# Patient Record
Sex: Male | Born: 1955 | Race: White | Hispanic: No | Smoking: Never smoker
Health system: Southern US, Community
[De-identification: ages and names within clinical notes are randomized; demographics above are authoritative.]

## PROBLEM LIST (undated history)

## (undated) DIAGNOSIS — R569 Unspecified convulsions: Secondary | ICD-10-CM

## (undated) DIAGNOSIS — M5020 Other cervical disc displacement, unspecified cervical region: Secondary | ICD-10-CM

## (undated) DIAGNOSIS — T4145XA Adverse effect of unspecified anesthetic, initial encounter: Secondary | ICD-10-CM

## (undated) DIAGNOSIS — R52 Pain, unspecified: Secondary | ICD-10-CM

## (undated) DIAGNOSIS — T8859XA Other complications of anesthesia, initial encounter: Secondary | ICD-10-CM

## (undated) DIAGNOSIS — S86019A Strain of unspecified Achilles tendon, initial encounter: Secondary | ICD-10-CM

## (undated) DIAGNOSIS — Z87442 Personal history of urinary calculi: Secondary | ICD-10-CM

---

## 1988-12-09 HISTORY — PX: BACK SURGERY: SHX140

## 2002-03-13 ENCOUNTER — Encounter: Admission: RE | Admit: 2002-03-13 | Discharge: 2002-03-13 | Payer: Self-pay | Admitting: Internal Medicine

## 2002-03-13 ENCOUNTER — Encounter: Payer: Self-pay | Admitting: Internal Medicine

## 2008-12-17 ENCOUNTER — Ambulatory Visit: Payer: Self-pay | Admitting: Gastroenterology

## 2009-06-02 ENCOUNTER — Emergency Department (HOSPITAL_BASED_OUTPATIENT_CLINIC_OR_DEPARTMENT_OTHER): Admission: EM | Admit: 2009-06-02 | Discharge: 2009-06-02 | Payer: Self-pay | Admitting: Emergency Medicine

## 2010-06-23 ENCOUNTER — Encounter (INDEPENDENT_AMBULATORY_CARE_PROVIDER_SITE_OTHER): Payer: Self-pay | Admitting: *Deleted

## 2010-07-13 NOTE — Letter (Signed)
Summary: Pre Visit Letter Revised  [facility]  [address] [place], [place] [address]   Phone: [phone]  Fax: [phone]        06/23/2010 MRN: [id number] Jose Rhodes [address] [place], [place]  [address]             Procedure Date:  08/03/2010 @ 11:00   Direct colon-Dr. Arlyce Dice   Welcome to the Gastroenterology Division at [facility].    You are scheduled to see a nurse for your pre-procedure visit on 07/20/2010 at 3:30 on the 3rd floor at [facility], [address]  We ask that you try to arrive at our office 15 minutes prior to your appointment time to allow for check-in.  Please take a minute to review the attached form.  If you answer "Yes" to one or more of the questions on the first page, we ask that you call the person listed at your earliest opportunity.  If you answer "No" to all of the questions, please complete the rest of the form and bring it to your appointment.    Your nurse visit will consist of discussing your medical and surgical history, your immediate family medical history, and your medications.   If you are unable to list all of your medications on the form, please bring the medication bottles to your appointment and we will list them.  We will need to be aware of both prescribed and over the counter drugs.  We will need to know exact dosage information as well.    Please be prepared to read and sign documents such as consent forms, a financial agreement, and acknowledgement forms.  If necessary, and with your consent, a friend or relative is welcome to sit-in on the nurse visit with you.  Please bring your insurance card so that we may make a copy of it.  If your insurance requires a referral to see a specialist, please bring your referral form from your primary care physician.  No co-pay is required for this nurse visit.     If you cannot keep your appointment, please call [phone] to cancel or reschedule prior to your  appointment date.  This allows [place] the opportunity to schedule an appointment for another patient in need of care.    Thank you for choosing [facility] for your medical needs.  We appreciate the opportunity to care for you.  Please visit [place] at our website  to learn more about our practice.  Sincerely, The Gastroenterology Division

## 2010-07-19 ENCOUNTER — Encounter (INDEPENDENT_AMBULATORY_CARE_PROVIDER_SITE_OTHER): Payer: Self-pay | Admitting: *Deleted

## 2010-07-20 ENCOUNTER — Encounter: Payer: Self-pay | Admitting: Gastroenterology

## 2010-07-27 NOTE — Miscellaneous (Signed)
Summary: [facility]  Clinical Lists Changes  Medications: Added new medication of MOVIPREP 100 GM  SOLR (PEG-KCL-NACL-NASULF-NA ASC-C) As per prep instructions. - Signed Rx of MOVIPREP 100 GM  SOLR (PEG-KCL-NACL-NASULF-NA ASC-C) As per prep instructions.;  #1 x 0;  Signed;  Entered by: Ezra Sites RN;  Authorized by: Louis Meckel MD;  Method used: [facility]  [phone]*, [address] [place], [place]  [address], Ph: [phone] or [phone], Fax: [phone] Observations: Added new observation of NKA: T (07/20/2010 15:15)    Prescriptions: MOVIPREP 100 GM  SOLR (PEG-KCL-NACL-NASULF-NA ASC-C) As per prep instructions.  #1 x 0   Entered by:   Ezra Sites RN   Authorized by:   Louis Meckel MD   Signed by:   Ezra Sites RN on 07/20/2010   Method used:   [facility]ically to        [facility]  [phone]* (retail)       [address] [place], [place]  [address]       Ph: [phone] or [phone]       Fax: [phone]   RxID:   [id number]

## 2010-07-27 NOTE — Letter (Signed)
Summary: Moviprep Instructions  [facility]  [address]   [place], [place] [address]   Phone: [phone]  Fax: [phone]       Jose Rhodes    11/22/1955    MRN: [id number]        Procedure Day Dorna Bloom: Thursday, 08-03-10     Arrival Time: 10:00 a.m.     Procedure Time: 11:00 a.m.     Location of Procedure:                    x  [facility] (4th Floor)                        PREPARATION FOR COLONOSCOPY WITH MOVIPREP   Starting 5 days prior to your procedure 07-29-10 do not eat nuts, seeds, popcorn, corn, beans, peas,  salads, or any raw vegetables.  Do not take any fiber supplements (e.g. Metamucil, Citrucel, and Benefiber).  THE DAY BEFORE YOUR PROCEDURE         DATE: 08-02-10  DAY: Wednesday  1.  Drink clear liquids the entire day-NO SOLID FOOD  2.  Do not drink anything colored red or purple.  Avoid juices with pulp.  No orange juice.  3.  Drink at least 64 oz. (8 glasses) of fluid/clear liquids during the day to prevent dehydration and help the prep work efficiently.  CLEAR LIQUIDS INCLUDE: Water Jello Ice Popsicles Tea (sugar ok, no milk/cream) Powdered fruit flavored drinks Coffee (sugar ok, no milk/cream) Gatorade Juice: apple, white grape, white cranberry  Lemonade Clear bullion, consomm, broth Carbonated beverages (any kind) Strained chicken noodle soup Hard Candy                             4.  In the morning, mix first dose of MoviPrep solution:    Empty 1 Pouch A and 1 Pouch B into the disposable container    Add lukewarm drinking water to the top line of the container. Mix to dissolve    Refrigerate (mixed solution should be used within 24 hrs)  5.  Begin drinking the prep at 5:00 p.m. The MoviPrep container is divided by 4 marks.   Every 15 minutes drink the solution down to the next mark (approximately 8 oz) until the full liter is complete.   6.  Follow completed prep with 16 oz of clear liquid of your choice  (Nothing red or purple).  Continue to drink clear liquids until bedtime.  7.  Before going to bed, mix second dose of MoviPrep solution:    Empty 1 Pouch A and 1 Pouch B into the disposable container    Add lukewarm drinking water to the top line of the container. Mix to dissolve    Refrigerate  THE DAY OF YOUR PROCEDURE      DATE: 08-03-10  DAY: Thursday  Beginning at 6:00 a.m. (5 hours before procedure):         1. Every 15 minutes, drink the solution down to the next mark (approx 8 oz) until the full liter is complete.  2. Follow completed prep with 16 oz. of clear liquid of your choice.    3. You may drink clear liquids until 9:00 a.m.  (2 HOURS BEFORE PROCEDURE).   MEDICATION INSTRUCTIONS  Unless otherwise instructed, you should take regular prescription medications with a small sip of water   as early as possible the morning  of your procedure.         OTHER INSTRUCTIONS  You will need a responsible adult at least 54 years of age to accompany you and drive you home.   This person must remain in the waiting room during your procedure.  Wear loose fitting clothing that is easily removed.  Leave jewelry and other valuables at home.  However, you may wish to bring a book to read or  an iPod/MP3 player to listen to music as you wait for your procedure to start.  Remove all body piercing jewelry and leave at home.  Total time from sign-in until discharge is approximately 2-3 hours.  You should go home directly after your procedure and rest.  You can resume normal activities the  day after your procedure.  The day of your procedure you should not:   Drive   Make legal decisions   Operate machinery   Drink alcohol   Return to work  You will receive specific instructions about eating, activities and medications before you leave.    The above instructions have been reviewed and explained to me by   Ezra Sites RN  July 20, 2010 3:46 PM    I fully  understand and can verbalize these instructions _____________________________ Date _________

## 2010-08-03 ENCOUNTER — Encounter: Payer: Self-pay | Admitting: Gastroenterology

## 2010-08-03 ENCOUNTER — Other Ambulatory Visit (AMBULATORY_SURGERY_CENTER): Payer: BC Managed Care – PPO | Admitting: Gastroenterology

## 2010-08-03 DIAGNOSIS — Z1211 Encounter for screening for malignant neoplasm of colon: Secondary | ICD-10-CM

## 2010-08-03 DIAGNOSIS — Z800 Family history of malignant neoplasm of digestive organs: Secondary | ICD-10-CM

## 2010-08-08 NOTE — Procedures (Signed)
Summary: Colonoscopy  Patient: Jose Rhodes Note: All result statuses are Final unless otherwise noted.  Tests: (1) Colonoscopy (COL)   COL Colonoscopy           DONE     [facility]     [address]     [place], [place]  [address]           COLONOSCOPY PROCEDURE REPORT           PATIENT:  Jose, Rhodes  MR#:  [id number]     BIRTHDATE:  09-03-1955, 54 yrs. old  GENDER:  male           ENDOSCOPIST:  Barbette Hair. Arlyce Dice, MD     Referred by:  Nila Nephew, M.D.           PROCEDURE DATE:  08/03/2010     PROCEDURE:  Diagnostic Colonoscopy     ASA CLASS:  Class I     INDICATIONS:  1) Elevated Risk Screening  2) family history of     colon cancer Mother in 53's           MEDICATIONS:   Fentanyl 75 mcg IV, Versed 8 mg IV           DESCRIPTION OF PROCEDURE:   After the risks benefits and     alternatives of the procedure were thoroughly explained, informed     consent was obtained.  Digital rectal exam was performed and     revealed large external hemorrhoids.   The LB CF-H180AL [id number]     endoscope was introduced through the anus and advanced to the     cecum, which was identified by both the appendix and ileocecal     valve, without limitations.  The quality of the prep was     excellent, using MoviPrep.  The instrument was then slowly     withdrawn as the colon was fully examined.     <<PROCEDUREIMAGES>>           FINDINGS:  A normal appearing cecum, ileocecal valve, and     appendiceal orifice were identified. The ascending, hepatic     flexure, transverse, splenic flexure, descending, sigmoid colon,     and rectum appeared unremarkable (see image3, image5, image6,     image8, image12, image14, and image15).   Retroflexed views in the     rectum revealed no abnormalities.    The time to cecum =  2.50     minutes. The scope was then withdrawn (time =  6.50  min) from the     patient and the procedure completed.           COMPLICATIONS:  None           ENDOSCOPIC  IMPRESSION:     1) Normal colon     RECOMMENDATIONS:     1) Given your significant family history of colon cancer, you     should have a repeat colonoscopy in 5 years           REPEAT EXAM:   5 year(s) Colonoscopy           ______________________________     Barbette Hair. Arlyce Dice, MD           CC:           n.     eSIGNED:   Barbette Hair. Kaplan at 08/03/2010 11:35 AM           Clinton Gallant, [id number]  Note:  An exclamation mark (!) indicates a result that was not dispersed into the flowsheet. Document Creation Date: 08/03/2010 11:35 AM _______________________________________________________________________  (1) Order result status: Final Collection or observation date-time: 08/03/2010 11:31 Requested date-time:  Receipt date-time:  Reported date-time:  Referring Physician:   Ordering Physician: Melvia Heaps [phone]) Specimen Source:  Source: Launa Grill Order Number: [id number] Lab site:   Appended Document: Colonoscopy    Clinical Lists Changes  Observations: Added new observation of COLONNXTDUE: 07/2015 (08/03/2010 16:32)

## 2013-10-25 ENCOUNTER — Emergency Department (HOSPITAL_COMMUNITY): Payer: 59

## 2013-10-25 ENCOUNTER — Encounter (HOSPITAL_COMMUNITY): Payer: Self-pay | Admitting: Emergency Medicine

## 2013-10-25 ENCOUNTER — Emergency Department (HOSPITAL_COMMUNITY)
Admission: EM | Admit: 2013-10-25 | Discharge: 2013-10-25 | Disposition: A | Discharge status: Home / Self Care | Payer: 59 | Attending: Emergency Medicine | Admitting: Emergency Medicine

## 2013-10-25 DIAGNOSIS — S96819A Strain of other specified muscles and tendons at ankle and foot level, unspecified foot, initial encounter: Principal | ICD-10-CM

## 2013-10-25 DIAGNOSIS — M66369 Spontaneous rupture of flexor tendons, unspecified lower leg: Secondary | ICD-10-CM

## 2013-10-25 DIAGNOSIS — X500XXA Overexertion from strenuous movement or load, initial encounter: Secondary | ICD-10-CM | POA: Insufficient documentation

## 2013-10-25 DIAGNOSIS — Z87442 Personal history of urinary calculi: Secondary | ICD-10-CM | POA: Insufficient documentation

## 2013-10-25 DIAGNOSIS — S93499A Sprain of other ligament of unspecified ankle, initial encounter: Secondary | ICD-10-CM | POA: Insufficient documentation

## 2013-10-25 DIAGNOSIS — Y9364 Activity, baseball: Secondary | ICD-10-CM | POA: Insufficient documentation

## 2013-10-25 DIAGNOSIS — Y92838 Other recreation area as the place of occurrence of the external cause: Secondary | ICD-10-CM

## 2013-10-25 DIAGNOSIS — Y9239 Other specified sports and athletic area as the place of occurrence of the external cause: Secondary | ICD-10-CM | POA: Insufficient documentation

## 2013-10-25 MED ORDER — HYDROCODONE-ACETAMINOPHEN 5-325 MG PO TABS: 1.0000 | ORAL_TABLET | Freq: Four times a day (QID) | ORAL | Status: DC | PRN

## 2013-10-25 MED ORDER — NAPROXEN 500 MG PO TABS: 500.0000 mg | ORAL_TABLET | Freq: Two times a day (BID) | ORAL | Status: DC

## 2013-10-25 NOTE — Progress Notes (Signed)
Orthopedic Tech Progress Note Patient Details:  Jose Rhodes 10/08/1955 [id number]  Ortho Devices Type of Ortho Device: Ace wrap;Crutches;Post (short leg) splint Ortho Device/Splint Location: RLE Ortho Device/Splint Interventions: Ordered;Application   Jennye MoccasinAnthony Craig Damaria Vachon 10/25/2013, 7:26 PM

## 2013-10-25 NOTE — ED Notes (Signed)
Ortho at BS

## 2013-10-25 NOTE — ED Notes (Signed)
Pt reports playing baseball today and felt a pop in the back of his right ankle. Pt able to move affected foot.

## 2013-10-25 NOTE — Discharge Instructions (Signed)
Complete Achilles Tendon Rupture  An Achilles tendon rupture is an injury in which the tough, cord-like band that attaches the lower muscles of your leg to your heel (Achilles tendon) tears (ruptures). In a complete Achilles tendon rupture, you are not able to stand up on the toes of the injured leg.  CAUSES  A tendon may rupture if it is weakened or weakening (degenerative) and a sudden stress is applied to it. Weakening or degeneration of a tendon may be caused by:  · Recurrent injuries, such as those causing Achilles tendinitis.  · Damaged tendons.  · Aging.  · Vascular disease of the tendon.  SIGNS AND SYMPTOMS  · Feeling as if you were struck violently in the back of the ankle.  · Hearing a "pop" and experiencing severe, sudden (acute) pain (however, the absence of pain does not mean there was not a rupture).  DIAGNOSIS  A physical exam is usually all that is needed to diagnose an Achilles tendon rupture. During the exam, your health care provider will touch the tendon and the structures around it. You may be asked to lie on your stomach or kneel on a chair while your health care provider squeezes your calf muscle. You most likely have a ruptured tendon if your foot does not flex.   Sometimes tests are performed. These may include:  · An ultrasound. This allows quick confirmation of the diagnosis.  · An X-ray.  · An MRI.  TREATMENT   A complete Achilles tendon rupture is treated with surgery. You will have a cast while the injury heals (this usually takes 6 10 weeks). Before your surgery, treatment may consist of:  · Ice applied to the area.  · Pain-relieving medicines.  · Rest.  · Crutches.  · Keeping the ankle from moving (immobilization ), usually with a splint.  HOME CARE INSTRUCTIONS   · Apply ice to the injured area:    · Put ice in a plastic bag.    · Place a towel between your skin and the bag.    · Leave the ice on for 20 minutes, 2 3 times a day.    · Use crutches and move about only as instructed.     · Keep the leg elevated above the level of the heart (the center of the chest) at all times when not using the bathroom. Do not dangle the leg over a chair, couch, or bed. When lying down, elevate your leg on a few pillows. Elevation prevents swelling and reduces pain.    · Avoid use of the injured area other than gentle range of motion of the toes.    · Do not drive a car until your health care provider specifically tells you it is safe to do so.    · Only take over-the-counter or prescription medicines for pain, discomfort, or fever as directed by your health care provider.  · Keep all follow-up appointments with your health care provider.  SEEK MEDICAL CARE IF:   · Your pain and swelling increase, or your pain is not controlled by medicines.    · You have new, unexplained symptoms, or your symptoms get worse.    · You cannot move your toes or foot.  · You develop warmth and swelling in your foot.  · You have an unexplained fever.  MAKE SURE YOU:   · Understand these instructions.  · Will watch your condition.  · Will get help right away if you are not doing well or get worse.  Document Released: 03/07/2005 Document Revised:   03/18/2013 Document Reviewed: 01/16/2013  Exit[employer]® Patient Information ©2014 [employer].

## 2013-10-25 NOTE — ED Provider Notes (Signed)
CSN: [id number]     Arrival date & time 10/25/13  1712 History  This chart was scribed for non-physician practitioner, Arthor CaptainAbigail Aminata Buffalo, PA-C working with Glynn OctaveStephen Rancour, MD by Greggory StallionKayla Andersen, ED scribe. This patient was seen in room TR08C/TR08C and the patient's care was started at 6:55 PM.   Chief Complaint  Patient presents with  . Ankle Pain   The history is provided by the patient. No language interpreter was used.   HPI Comments: Dub Mikesddie F Balderston is a 58 y.o. male who presents to the Emergency Department complaining of right ankle injury that occurred earlier today around 4:15 PM. Pt was playing baseball and felt a pop in the back of his ankle. He has sudden onset right ankle pain with associated swelling. Bearing weight worsens the pain.   Past Medical History  Diagnosis Date  . Kidney stones    Past Surgical History  Procedure Laterality Date  . Back surgery     No family history on file. History  Substance Use Topics  . Smoking status: Never Smoker   . Smokeless tobacco: Not on file  . Alcohol Use: No    Review of Systems  Constitutional: Negative for fever.  HENT: Negative for congestion.   Eyes: Negative for redness.  Respiratory: Negative for shortness of breath.   Cardiovascular: Negative for chest pain.  Gastrointestinal: Negative for abdominal distention.  Musculoskeletal: Positive for arthralgias and joint swelling.  Skin: Negative for rash.  Neurological: Negative for speech difficulty.  Psychiatric/Behavioral: Negative for confusion.   Allergies  Review of patient's allergies indicates no known allergies.  Home Medications   Prior to Admission medications   Not on File   BP 109/63  Pulse 82  Temp(Src) 98.2 F (36.8 C) (Oral)  Resp 16  Ht 5\' 11"  (1.803 m)  Wt 180 lb (81.647 kg)  BMI 25.12 kg/m2  SpO2 97%  Physical Exam  Nursing note and vitals reviewed. Constitutional: He is oriented to person, place, and time. He appears well-developed and  well-nourished. No distress.  HENT:  Head: Normocephalic and atraumatic.  Eyes: EOM are normal.  Neck: Neck supple. No tracheal deviation present.  Cardiovascular: Normal rate.   Pulmonary/Chest: Effort normal. No respiratory distress.  Musculoskeletal: Normal range of motion.  Positive Thompson test on right side. Right achilles tendon very soft with swelling superiorly.   Neurological: He is alert and oriented to person, place, and time.  Skin: Skin is warm and dry.  Psychiatric: He has a normal mood and affect. His behavior is normal.    ED Course  Procedures (including critical care time)  DIAGNOSTIC STUDIES: Oxygen Saturation is 97% on RA, normal by my interpretation.    COORDINATION OF CARE: 6:59 PM-Discussed treatment plan which includes posterior splint and orthopedic follow up with pt at bedside and pt agreed to plan.   Labs Review Labs Reviewed - No data to display  Imaging Review Dg Ankle Complete Right  10/25/2013   CLINICAL DATA:  Excruciating right ankle pain  EXAM: RIGHT ANKLE - COMPLETE 3+ VIEW  COMPARISON:  None.  FINDINGS: There is no evidence of fracture, dislocation. There is no evidence of arthropathy or other focal bone abnormality. There is swelling posterior soft tissues of the ankle.  IMPRESSION: No acute bony injury. If Achilles tendon tear is of clinical concern, MRI would be helpful.   Electronically Signed   By: Sherian ReinWei-Chen  Lin M.D.   On: 10/25/2013 18:17     EKG Interpretation None  MDM   Final diagnoses:  Tendon rupture, nontraumatic, Achilles    Patient with apparent L complete achilles tendon rupture.  Unable to ambulate. I have placed the patient in Posterior leg splint and given crutches. He is to remain NWB. Pain medications given. F/u with ortho ASAP.  I personally performed the services described in this documentation, which was scribed in my presence. The recorded information has been reviewed and is accurate.  Arthor CaptainAbigail Michille Mcelrath,  PA-C 10/27/13 [phone]

## 2013-10-27 NOTE — ED Provider Notes (Signed)
Medical screening examination/treatment/procedure(s) were performed by non-physician practitioner and as supervising physician I was immediately available for consultation/collaboration.   EKG Interpretation None        Jacquita Mulhearn, MD 10/27/13 1453 

## 2013-10-28 ENCOUNTER — Other Ambulatory Visit: Payer: Self-pay | Admitting: Surgical

## 2013-10-28 NOTE — H&P (Signed)
Jose Rhodes is an 58 y.o. male.   Chief Complaint: right ankle pain HPI: The patient is a 58 year old male who presented pain which began 3 days ago following a specific injury. The injury occurred while the patient was playing sports. He was playing in a baseball game and felt a pop in his Achilles when he stepped on third base. Symptoms are reported to be located in the right ankle and include swelling, decreased range of motion, difficulty bearing weight and difficulty ambulating. The patient does describe their pain as sharp and aching. The patient describes their symptoms as moderate in severity. Symptoms are exacerbated by weight bearing and walking. Symptoms are relieved by immobilization and non-opioid analgesics.   Past Medical History  Diagnosis Date  . Kidney stones     Past Surgical History  Procedure Laterality Date  . Back surgery      Social History:  reports that he has never smoked. He does not have any smokeless tobacco history on file. He reports that he does not drink alcohol or use illicit drugs.  Allergies: No Known Allergies  Medication History Naproxen Sodium ER (500MG  Tablet ER 24HR, Oral) Active.  Review of Systems  Constitutional: Negative.   HENT: Negative.   Eyes: Negative.   Respiratory: Negative.   Cardiovascular: Negative.   Gastrointestinal: Negative.   Genitourinary: Negative.   Musculoskeletal: Positive for falls and joint pain. Negative for back pain, myalgias and neck pain.       Right ankle pain  Skin: Negative.   Neurological: Negative.   Endo/Heme/Allergies: Negative.   Psychiatric/Behavioral: Negative.    Vitals Weight: 180 lb Height: 71 in Body Surface Area: 2.02 m Body Mass Index: 25.1 kg/m BP: 107/74 (Sitting, Left Arm, Standard)  HR: 64 bpm  Physical Exam  Constitutional: He is oriented to person, place, and time. He appears well-developed and well-nourished. No distress.  HENT:  Head: Normocephalic and  atraumatic.  Right Ear: External ear normal.  Left Ear: External ear normal.  Nose: Nose normal.  Mouth/Throat: Oropharynx is clear and moist.  Eyes: Conjunctivae and EOM are normal.  Neck: Normal range of motion. Neck supple.  Cardiovascular: Normal rate, regular rhythm, normal heart sounds and intact distal pulses.   No murmur heard. Respiratory: Effort normal and breath sounds normal. No respiratory distress. He has no wheezes.  GI: Bowel sounds are normal. He exhibits no distension. There is no tenderness.  Musculoskeletal:       Right knee: Normal.       Left knee: Normal.       Right ankle: He exhibits decreased range of motion, swelling and ecchymosis. He exhibits normal pulse. No tenderness. No lateral malleolus, no medial malleolus, no AITFL and no head of 5th metatarsal tenderness found. Achilles tendon exhibits pain, defect and abnormal Thompson's test results.       Left ankle: Normal.  Neurological: He is alert and oriented to person, place, and time. He has normal strength and normal reflexes. No sensory deficit.  Skin: No rash noted. He is not diaphoretic. No erythema.  Psychiatric: He has a normal mood and affect. His behavior is normal.     Assessment/Plan Right Achilles tendon rupture He needs a right Achilles tendon repair with graft. Risks and benefits of the surgery discussed with the patient by Dr. Darrelyn HillockGioffre.   Jimya Ciani Tamala SerLauren Neosha Switalski 10/28/2013, 3:58 PM

## 2013-10-29 ENCOUNTER — Encounter (HOSPITAL_COMMUNITY): Payer: Self-pay | Admitting: *Deleted

## 2013-10-29 NOTE — Progress Notes (Signed)
PT IS ADD ON FOR SURGERY TOMORROW - PREOP INSTRUCTIONS AND CHLORHEXIDINE INSTRUCTIONS/ PRECAUTIONS REVIEWED WITH PT BY PHONE.

## 2013-10-30 ENCOUNTER — Encounter (HOSPITAL_COMMUNITY): Payer: Self-pay

## 2013-10-30 ENCOUNTER — Encounter (HOSPITAL_COMMUNITY): Payer: 59 | Admitting: Anesthesiology

## 2013-10-30 ENCOUNTER — Ambulatory Visit (HOSPITAL_COMMUNITY): Payer: 59

## 2013-10-30 ENCOUNTER — Encounter (HOSPITAL_COMMUNITY)
Admission: RE | Disposition: A | Discharge status: Home / Self Care | Payer: Self-pay | Source: Ambulatory Visit | Attending: Orthopedic Surgery

## 2013-10-30 ENCOUNTER — Observation Stay (HOSPITAL_COMMUNITY)
Admission: RE | Admit: 2013-10-30 | Discharge: 2013-10-31 | Disposition: A | Discharge status: Home / Self Care | Payer: 59 | Source: Ambulatory Visit | Attending: Orthopedic Surgery | Admitting: Orthopedic Surgery

## 2013-10-30 ENCOUNTER — Ambulatory Visit (HOSPITAL_COMMUNITY): Payer: 59 | Admitting: Anesthesiology

## 2013-10-30 DIAGNOSIS — X58XXXA Exposure to other specified factors, initial encounter: Secondary | ICD-10-CM | POA: Insufficient documentation

## 2013-10-30 DIAGNOSIS — Y9364 Activity, baseball: Secondary | ICD-10-CM | POA: Insufficient documentation

## 2013-10-30 DIAGNOSIS — S96819A Strain of other specified muscles and tendons at ankle and foot level, unspecified foot, initial encounter: Principal | ICD-10-CM

## 2013-10-30 DIAGNOSIS — S93499A Sprain of other ligament of unspecified ankle, initial encounter: Principal | ICD-10-CM | POA: Insufficient documentation

## 2013-10-30 DIAGNOSIS — S86019A Strain of unspecified Achilles tendon, initial encounter: Secondary | ICD-10-CM | POA: Diagnosis present

## 2013-10-30 HISTORY — DX: Personal history of urinary calculi: Z87.442

## 2013-10-30 HISTORY — DX: Adverse effect of unspecified anesthetic, initial encounter: T41.45XA

## 2013-10-30 HISTORY — DX: Other cervical disc displacement, unspecified cervical region: M50.20

## 2013-10-30 HISTORY — PX: ACHILLES TENDON SURGERY: SHX542

## 2013-10-30 HISTORY — DX: Strain of unspecified achilles tendon, initial encounter: S86.019A

## 2013-10-30 HISTORY — DX: Pain, unspecified: R52

## 2013-10-30 HISTORY — DX: Other complications of anesthesia, initial encounter: T88.59XA

## 2013-10-30 HISTORY — DX: Unspecified convulsions: R56.9

## 2013-10-30 LAB — PROTIME-INR
INR: 1.04 (ref 0.00–1.49)
Prothrombin Time: 13.4 seconds (ref 11.6–15.2)

## 2013-10-30 LAB — COMPREHENSIVE METABOLIC PANEL
ALT: 18 U/L (ref 0–53)
AST: 19 U/L (ref 0–37)
Albumin: 3.7 g/dL (ref 3.5–5.2)
Alkaline Phosphatase: 73 U/L (ref 39–117)
BUN: 17 mg/dL (ref 6–23)
CO2: 28 mEq/L (ref 19–32)
Calcium: 9 mg/dL (ref 8.4–10.5)
Chloride: 105 mEq/L (ref 96–112)
Creatinine, Ser: 0.99 mg/dL (ref 0.50–1.35)
GFR calc Af Amer: >90 mL/min (ref >90)
GFR calc non Af Amer: 90 mL/min — ABNORMAL LOW (ref >90)
Glucose, Bld: 93 mg/dL (ref 70–99)
Potassium: 4.1 mEq/L (ref 3.7–5.3)
Sodium: 143 mEq/L (ref 137–147)
Total Bilirubin: 0.8 mg/dL (ref 0.3–1.2)
Total Protein: 6.6 g/dL (ref 6.0–8.3)

## 2013-10-30 LAB — CBC
HCT: 43.9 % (ref 39.0–52.0)
HEMOGLOBIN: 15.2 g/dL (ref 13.0–17.0)
MCH: 30.1 pg (ref 26.0–34.0)
MCHC: 34.6 g/dL (ref 30.0–36.0)
MCV: 86.9 fL (ref 78.0–100.0)
Platelets: 169 10*3/uL (ref 150–400)
RBC: 5.05 MIL/uL (ref 4.22–5.81)
RDW: 12.7 % (ref 11.5–15.5)
WBC: 4.7 10*3/uL (ref 4.0–10.5)

## 2013-10-30 LAB — URINALYSIS, ROUTINE W REFLEX MICROSCOPIC
Bilirubin Urine: NEGATIVE
Glucose, UA: NEGATIVE mg/dL
Hgb urine dipstick: NEGATIVE
Ketones, ur: NEGATIVE mg/dL
Leukocytes, UA: NEGATIVE
Nitrite: NEGATIVE
Protein, ur: NEGATIVE mg/dL
Specific Gravity, Urine: 1.023 (ref 1.005–1.030)
Urobilinogen, UA: 0.2 mg/dL (ref 0.0–1.0)
pH: 5.5 (ref 5.0–8.0)

## 2013-10-30 LAB — APTT: aPTT: 26 seconds (ref 24–37)

## 2013-10-30 SURGERY — REPAIR, TENDON, ACHILLES
Anesthesia: General | Site: Ankle | Laterality: Right

## 2013-10-30 MED ORDER — ALPRAZOLAM 1 MG PO TABS
1.0000 mg | ORAL_TABLET | Freq: Every day | ORAL | Status: DC
  Administered 2013-10-30: 1 mg via ORAL
  Filled 2013-10-30: qty 1

## 2013-10-30 MED ORDER — FENTANYL CITRATE 0.05 MG/ML IJ SOLN
INTRAMUSCULAR | Status: AC
  Filled 2013-10-30: qty 2

## 2013-10-30 MED ORDER — HYDROCODONE-ACETAMINOPHEN 5-325 MG PO TABS
1.0000 | ORAL_TABLET | ORAL | Status: DC | PRN
  Administered 2013-10-30 – 2013-10-31 (×6): 2 via ORAL
  Filled 2013-10-30 (×6): qty 2

## 2013-10-30 MED ORDER — LACTATED RINGERS IV SOLN
INTRAVENOUS | Status: DC
  Administered 2013-10-30: 22:00:00 via INTRAVENOUS

## 2013-10-30 MED ORDER — CEFAZOLIN SODIUM 1-5 GM-% IV SOLN
1.0000 g | Freq: Four times a day (QID) | INTRAVENOUS | Status: AC
  Administered 2013-10-30 – 2013-10-31 (×3): 1 g via INTRAVENOUS
  Filled 2013-10-30 (×3): qty 50

## 2013-10-30 MED ORDER — ONDANSETRON HCL 4 MG PO TABS: 4.0000 mg | ORAL_TABLET | Freq: Four times a day (QID) | ORAL | Status: DC | PRN

## 2013-10-30 MED ORDER — POLYETHYLENE GLYCOL 3350 17 G PO PACK: 17.0000 g | PACK | Freq: Every day | ORAL | Status: DC | PRN

## 2013-10-30 MED ORDER — ONDANSETRON HCL 4 MG/2ML IJ SOLN
INTRAMUSCULAR | Status: AC
  Filled 2013-10-30: qty 2

## 2013-10-30 MED ORDER — PROPOFOL 10 MG/ML IV BOLUS
INTRAVENOUS | Status: AC
  Filled 2013-10-30: qty 20

## 2013-10-30 MED ORDER — FLEET ENEMA 7-19 GM/118ML RE ENEM: 1.0000 | ENEMA | Freq: Once | RECTAL | Status: AC | PRN

## 2013-10-30 MED ORDER — FENTANYL CITRATE 0.05 MG/ML IJ SOLN
INTRAMUSCULAR | Status: AC
  Filled 2013-10-30: qty 5

## 2013-10-30 MED ORDER — ONDANSETRON HCL 4 MG/2ML IJ SOLN: 4.0000 mg | Freq: Four times a day (QID) | INTRAMUSCULAR | Status: DC | PRN

## 2013-10-30 MED ORDER — LACTATED RINGERS IV SOLN: INTRAVENOUS | Status: DC

## 2013-10-30 MED ORDER — BISACODYL 10 MG RE SUPP: 10.0000 mg | Freq: Every day | RECTAL | Status: DC | PRN

## 2013-10-30 MED ORDER — FENTANYL CITRATE 0.05 MG/ML IJ SOLN
INTRAMUSCULAR | Status: DC | PRN
  Administered 2013-10-30: 100 ug via INTRAVENOUS

## 2013-10-30 MED ORDER — MIDAZOLAM HCL 2 MG/2ML IJ SOLN
INTRAMUSCULAR | Status: AC
  Filled 2013-10-30: qty 2

## 2013-10-30 MED ORDER — HYDROMORPHONE HCL PF 1 MG/ML IJ SOLN
1.0000 mg | INTRAMUSCULAR | Status: DC | PRN
Start: 2013-10-30 — End: 2013-10-31

## 2013-10-30 MED ORDER — ENOXAPARIN SODIUM 40 MG/0.4ML ~~LOC~~ SOLN
40.0000 mg | SUBCUTANEOUS | Status: DC
  Administered 2013-10-31: 40 mg via SUBCUTANEOUS
  Filled 2013-10-30 (×3): qty 0.4

## 2013-10-30 MED ORDER — METHOCARBAMOL 1000 MG/10ML IJ SOLN
500.0000 mg | Freq: Four times a day (QID) | INTRAVENOUS | Status: DC | PRN
  Administered 2013-10-30: 500 mg via INTRAVENOUS
  Filled 2013-10-30: qty 5

## 2013-10-30 MED ORDER — POLYMYXIN B SULFATE 500000 UNITS IJ SOLR
INTRAMUSCULAR | Status: DC | PRN
  Administered 2013-10-30: 10:00:00

## 2013-10-30 MED ORDER — BACITRACIN-NEOMYCIN-POLYMYXIN 400-5-5000 EX OINT
TOPICAL_OINTMENT | CUTANEOUS | Status: AC
  Filled 2013-10-30: qty 1

## 2013-10-30 MED ORDER — SUCCINYLCHOLINE CHLORIDE 20 MG/ML IJ SOLN
INTRAMUSCULAR | Status: DC | PRN
  Administered 2013-10-30: 100 mg via INTRAVENOUS

## 2013-10-30 MED ORDER — OXYCODONE-ACETAMINOPHEN 5-325 MG PO TABS
1.0000 | ORAL_TABLET | ORAL | Status: DC | PRN
  Filled 2013-10-30: qty 2

## 2013-10-30 MED ORDER — MIDAZOLAM HCL 5 MG/5ML IJ SOLN
INTRAMUSCULAR | Status: DC | PRN
  Administered 2013-10-30: 2 mg via INTRAVENOUS

## 2013-10-30 MED ORDER — EPHEDRINE SULFATE 50 MG/ML IJ SOLN
INTRAMUSCULAR | Status: DC | PRN
  Administered 2013-10-30: 7.5 mg via INTRAVENOUS

## 2013-10-30 MED ORDER — BACITRACIN-NEOMYCIN-POLYMYXIN 400-5-5000 EX OINT
TOPICAL_OINTMENT | CUTANEOUS | Status: DC | PRN
  Administered 2013-10-30: 1 via TOPICAL

## 2013-10-30 MED ORDER — CEFAZOLIN SODIUM-DEXTROSE 2-3 GM-% IV SOLR
2.0000 g | INTRAVENOUS | Status: AC
  Administered 2013-10-30: 2 g via INTRAVENOUS

## 2013-10-30 MED ORDER — LACTATED RINGERS IV SOLN
INTRAVENOUS | Status: DC
  Administered 2013-10-30 (×3): via INTRAVENOUS

## 2013-10-30 MED ORDER — PROPOFOL 10 MG/ML IV BOLUS
INTRAVENOUS | Status: DC | PRN
  Administered 2013-10-30: 150 mg via INTRAVENOUS

## 2013-10-30 MED ORDER — CEFAZOLIN SODIUM-DEXTROSE 2-3 GM-% IV SOLR
INTRAVENOUS | Status: AC
  Filled 2013-10-30: qty 50

## 2013-10-30 MED ORDER — ONDANSETRON HCL 4 MG/2ML IJ SOLN
INTRAMUSCULAR | Status: DC | PRN
  Administered 2013-10-30: 4 mg via INTRAVENOUS

## 2013-10-30 MED ORDER — METHOCARBAMOL 500 MG PO TABS
500.0000 mg | ORAL_TABLET | Freq: Four times a day (QID) | ORAL | Status: DC | PRN
  Administered 2013-10-30 – 2013-10-31 (×2): 500 mg via ORAL
  Filled 2013-10-30 (×2): qty 1

## 2013-10-30 MED ORDER — BUPIVACAINE LIPOSOME 1.3 % IJ SUSP
20.0000 mL | Freq: Once | INTRAMUSCULAR | Status: AC
  Administered 2013-10-30: 10 mL
  Filled 2013-10-30: qty 20

## 2013-10-30 MED ORDER — FENTANYL CITRATE 0.05 MG/ML IJ SOLN
25.0000 ug | INTRAMUSCULAR | Status: DC | PRN
  Administered 2013-10-30: 50 ug via INTRAVENOUS

## 2013-10-30 SURGICAL SUPPLY — 38 items
BAG SPEC THK2 15X12 ZIP CLS (MISCELLANEOUS) ×1
BAG ZIPLOCK 12X15 (MISCELLANEOUS) ×3 IMPLANT
BIT DRILL 2.8X128 (BIT) ×1 IMPLANT
BIT DRILL 2.8X128MM (BIT)
BLADE SURG SZ10 CARB STEEL (BLADE) ×3 IMPLANT
BNDG GAUZE ELAST 4 BULKY (GAUZE/BANDAGES/DRESSINGS) ×2 IMPLANT
DERMASPAN .5-.9MM 4X4CM SHOU (Miscellaneous) ×2 IMPLANT
DRSG PAD ABDOMINAL 8X10 ST (GAUZE/BANDAGES/DRESSINGS) ×3 IMPLANT
DURAPREP 26ML APPLICATOR (WOUND CARE) ×3 IMPLANT
ELECT REM PT RETURN 9FT ADLT (ELECTROSURGICAL) ×3
ELECTRODE REM PT RTRN 9FT ADLT (ELECTROSURGICAL) ×1 IMPLANT
GAUZE SPONGE 4X4 16PLY XRAY LF (GAUZE/BANDAGES/DRESSINGS) ×2 IMPLANT
GLOVE BIOGEL PI IND STRL 8 (GLOVE) ×1 IMPLANT
GLOVE BIOGEL PI INDICATOR 8 (GLOVE) ×2
GLOVE ECLIPSE 8.0 STRL XLNG CF (GLOVE) ×6 IMPLANT
GOWN STRL REUS W/TWL LRG LVL3 (GOWN DISPOSABLE) ×9 IMPLANT
GOWN STRL REUS W/TWL XL LVL3 (GOWN DISPOSABLE) ×6 IMPLANT
MANIFOLD NEPTUNE II (INSTRUMENTS) ×3 IMPLANT
NS IRRIG 1000ML POUR BTL (IV SOLUTION) ×3 IMPLANT
PACK LOWER EXTREMITY WL (CUSTOM PROCEDURE TRAY) ×3 IMPLANT
PAD ABD 8X10 STRL (GAUZE/BANDAGES/DRESSINGS) ×2 IMPLANT
PAD CAST 4YDX4 CTTN HI CHSV (CAST SUPPLIES) ×1 IMPLANT
PADDING CAST COTTON 4X4 STRL (CAST SUPPLIES) ×9
PADDING CAST COTTON 6X4 STRL (CAST SUPPLIES) ×3 IMPLANT
PASSER SUT SWANSON 36MM LOOP (INSTRUMENTS) ×3 IMPLANT
POSITIONER SURGICAL ARM (MISCELLANEOUS) ×1 IMPLANT
SPLINT FIBERGLASS 4X30 (CAST SUPPLIES) ×2 IMPLANT
SPONGE GAUZE 4X4 12PLY (GAUZE/BANDAGES/DRESSINGS) ×3 IMPLANT
STAPLER VISISTAT (STAPLE) ×3 IMPLANT
SUT ETHIBOND #5 BRAIDED 30INL (SUTURE) ×2 IMPLANT
SUT ETHILON 2 0 PS N (SUTURE) ×2 IMPLANT
SUT ETHILON 3 0 PS 1 (SUTURE) ×6 IMPLANT
SUT VIC AB 1 CT1 27 (SUTURE) ×9
SUT VIC AB 1 CT1 27XBRD ANTBC (SUTURE) ×1 IMPLANT
SUT VIC AB 2-0 CT1 27 (SUTURE) ×3
SUT VIC AB 2-0 CT1 TAPERPNT 27 (SUTURE) ×1 IMPLANT
TOWEL OR 17X26 10 PK STRL BLUE (TOWEL DISPOSABLE) ×6 IMPLANT
WATER STERILE IRR 1500ML POUR (IV SOLUTION) ×3 IMPLANT

## 2013-10-30 NOTE — Interval H&P Note (Signed)
History and Physical Interval Note:  10/30/2013 8:38 AM  Jose Rhodes  has presented today for surgery, with the diagnosis of RIGHT ACHILLES TENDON REPUTRE  The various methods of treatment have been discussed with the patient and family. After consideration of risks, benefits and other options for treatment, the patient has consented to  Procedure(s): RIGHT ACHILLES TENDON REPAIR WITH GRAFT  (Right) as a surgical intervention .  The patient's history has been reviewed, patient examined, no change in status, stable for surgery.  I have reviewed the patient's chart and labs.  Questions were answered to the patient's satisfaction.     Jacki Cones

## 2013-10-30 NOTE — Anesthesia Postprocedure Evaluation (Signed)
  Anesthesia Post-op Note  Patient: Jose Rhodes  Procedure(s) Performed: Procedure(s) (LRB): RIGHT ACHILLES TENDON REPAIR WITH GRAFT  (Right)  Patient Location: PACU  Anesthesia Type: General  Level of Consciousness: awake and alert   Airway and Oxygen Therapy: Patient Spontanous Breathing  Post-op Pain: mild  Post-op Assessment: Post-op Vital signs reviewed, Patient's Cardiovascular Status Stable, Respiratory Function Stable, Patent Airway and No signs of Nausea or vomiting  Last Vitals:  Filed Vitals:   10/30/13 1200  BP: 115/69  Pulse: 58  Temp: 36.3 C  Resp: 15    Post-op Vital Signs: stable   Complications: No apparent anesthesia complications

## 2013-10-30 NOTE — Anesthesia Preprocedure Evaluation (Signed)
Anesthesia Evaluation  Patient identified by MRN, date of birth, ID band Patient awake    Reviewed: Allergy & Precautions, H&P , NPO status , Patient's Chart, lab work & pertinent test results  Airway Mallampati: II  TM Distance: >3 FB Neck ROM: full    Dental no notable dental hx. (+) Teeth Intact, Dental Advisory Given   Pulmonary neg pulmonary ROS,  breath sounds clear to auscultation  Pulmonary exam normal       Cardiovascular Exercise Tolerance: Good negative cardio ROS  Rhythm:regular Rate:Normal     Neuro/Psych negative neurological ROS  negative psych ROS   GI/Hepatic negative GI ROS, Neg liver ROS,   Endo/Other  negative endocrine ROS  Renal/GU negative Renal ROS  negative genitourinary   Musculoskeletal   Abdominal   Peds  Hematology negative hematology ROS (+)   Anesthesia Other Findings   Reproductive/Obstetrics negative OB ROS                             Anesthesia Physical Anesthesia Plan  ASA: I  Anesthesia Plan: General   Post-op Pain Management:    Induction: Intravenous  Airway Management Planned: LMA  Additional Equipment:   Intra-op Plan:   Post-operative Plan:   Informed Consent: I have reviewed the patients History and Physical, chart, labs and discussed the procedure including the risks, benefits and alternatives for the proposed anesthesia with the patient or authorized representative who has indicated his/her understanding and acceptance.   Dental Advisory Given  Plan Discussed with: CRNA and Surgeon  Anesthesia Plan Comments:         Anesthesia Quick Evaluation  

## 2013-10-30 NOTE — Transfer of Care (Signed)
Immediate Anesthesia Transfer of Care Note  Patient: Jose Rhodes  Procedure(s) Performed: Procedure(s): RIGHT ACHILLES TENDON REPAIR WITH GRAFT  (Right)  Patient Location: PACU  Anesthesia Type:General  Level of Consciousness: awake, sedated and patient cooperative  Airway & Oxygen Therapy: Patient Spontanous Breathing and Patient connected to face mask oxygen  Post-op Assessment: Report given to PACU RN and Post -op Vital signs reviewed and stable  Post vital signs: Reviewed and stable  Complications: No apparent anesthesia complications

## 2013-10-30 NOTE — Op Note (Signed)
NAMETAKUMA, CANNING NO.:  [id number]  MEDICAL RECORD NO.:  [id number]  LOCATION:  1616                         FACILITY:  [facility]  PHYSICIAN:  Georges Lynch. Josilyn Shippee, M.D.DATE OF BIRTH:  June 01, 1956  DATE OF PROCEDURE:  10/30/2013 DATE OF DISCHARGE:                              OPERATIVE REPORT   SURGEON:  Georges Lynch. Darrelyn Hillock, M.D.  ASSISTANT:  Marlowe Kays, M.D.  PREOPERATIVE DIAGNOSIS:  Complete complex ruptured Achilles tendon on the right secondary to trauma.  POSTOPERATIVE DIAGNOSIS:  Complete complex ruptured Achilles tendon on the right secondary to trauma.  OPERATIONS: 1. Repair of a complete complex tear of the rotator cuff tendon on the     right. 2. DermaSpan graft for reinforcement of the repair. 3. Application of a short-leg cast with his foot in plantar flexion.  DESCRIPTION OF PROCEDURE:  Under general anesthesia, routine orthopedic prep and draping of the right lower extremity was carried out.  He had 2 g of IV Ancef.  The appropriate time-out was carried out prior to surgery.  I also marked the appropriate right leg in the holding area. The leg was exsanguinated with Esmarch.  Tourniquet was elevated at 325 mmHg.  At this time, a curvilinear incision was made over the medial aspect of the Achilles tendon.  Bleeders were identified and cauterized. I then identified both ends of the tendon.  Note, the tendon had a mop handle-type appearance.  It was severely torn.  What I did at this particular point, I brought both ends together with the foot acutely plantar flexed and then sutured it with #5-0 Ethibond suture and I reinforced it with some circumferential Vicryl sutures.  I then utilized a circumferential DermaSpan graft to reinforce the repair.  We had a nice repair.  At this time, we irrigated out the wound and closed the wound with 3-0 nylon suture.  Sterile Neosporin dressings were applied. All large bundle dressing was applied with the  patient's foot in plantar flexion.  At the end of the procedure, I did inject 10 mL of Exparel into the wound site.          ______________________________ Georges Lynch. Darrelyn Hillock, M.D.     RAG/MEDQ  D:  10/30/2013  T:  10/30/2013  Job:  [id number]

## 2013-10-30 NOTE — Brief Op Note (Signed)
10/30/2013  10:35 AM  PATIENT:  Dub Mikes  58 y.o. male  PRE-OPERATIVE DIAGNOSIS:  RIGHT ACHILLES TENDON REPUTRE  POST-OPERATIVE DIAGNOSIS:  RIGHT ACHILLES TENDON REPUTRE,Complete,Complex.  PROCEDURE:  Procedure(s): RIGHT ACHILLES TENDON REPAIR WITH GRAFT  (Right)  SURGEON:  Surgeon(s) and Role:    * Jacki Cones, MD - Primary    * Drucilla Schmidt, MD - Assisting  :   ASSISTANTS:James Aplington MD  ANESTHESIA:   general  EBL:  Total I/O In: 1000 [I.V.:1000] Out: -   BLOOD ADMINISTERED:none  DRAINS: none   LOCAL MEDICATIONS USED:  BUPIVICAINE 10cc  SPECIMEN:  No Specimen  DISPOSITION OF SPECIMEN:  N/A  COUNTS:  YES  TOURNIQUET:   Total Tourniquet Time Documented: Thigh (Right) - 32 minutes Total: Thigh (Right) - 32 minutes   DICTATION: .Other Dictation: Dictation Number [id number]  PLAN OF CARE: Admit for overnight observation  PATIENT DISPOSITION:  PACU - hemodynamically stable.   Delay start of Pharmacological VTE agent (>24hrs) due to surgical blood loss or risk of bleeding: yes

## 2013-10-31 LAB — BASIC METABOLIC PANEL
BUN: 10 mg/dL (ref 6–23)
CALCIUM: 8.5 mg/dL (ref 8.4–10.5)
CO2: 30 mEq/L (ref 19–32)
Chloride: 106 mEq/L (ref 96–112)
Creatinine, Ser: 1.01 mg/dL (ref 0.50–1.35)
GFR calc non Af Amer: 81 mL/min — ABNORMAL LOW (ref >90)
GLUCOSE: 95 mg/dL (ref 70–99)
Potassium: 4.5 mEq/L (ref 3.7–5.3)
SODIUM: 144 meq/L (ref 137–147)

## 2013-10-31 MED ORDER — ENOXAPARIN (LOVENOX) PATIENT EDUCATION KIT
PACK | Freq: Once | Status: AC
  Administered 2013-10-31: 09:00:00
  Filled 2013-10-31: qty 1

## 2013-10-31 MED ORDER — ENOXAPARIN SODIUM 40 MG/0.4ML ~~LOC~~ SOLN
40.0000 mg | SUBCUTANEOUS | Status: DC
Start: 2013-10-31 — End: 2016-06-05

## 2013-10-31 MED ORDER — OXYCODONE-ACETAMINOPHEN 5-325 MG PO TABS: 1.0000 | ORAL_TABLET | ORAL | Status: DC | PRN

## 2013-10-31 NOTE — Evaluation (Signed)
Physical Therapy Evaluation Patient Details Name: Jose Rhodes MRN: [id number] DOB: 06/21/1955 Today's Date: 10/31/2013   History of Present Illness  R achillies repair  Clinical Impression  Pt s/p R achilles repair presents with functional mobility limitations 2* NWB status and post op pain with activity.  Pt demonstrates ability to mobilize safely utilizing crutches or knee scooter and states he has assist as needed at home.  Pt ready for d/c this date from PT perspective.    Follow Up Recommendations No PT follow up    Equipment Recommendations  Other (comment) (Pt is very interested in a knee scooter)    Recommendations for Other Services       Precautions / Restrictions Precautions Precautions: Fall Restrictions Weight Bearing Restrictions: Yes RLE Weight Bearing: Non weight bearing      Mobility  Bed Mobility Overal bed mobility: Modified Independent             General bed mobility comments: no difficulties  Transfers Overall transfer level: Needs assistance Equipment used: Crutches Transfers: Sit to/from Stand Sit to Stand: Min guard;Supervision;Modified independent (Device/Increase time)         General transfer comment: cues for management of crutches, saftey in transition position and use of UEs  Ambulation/Gait Ambulation/Gait assistance: Supervision;Modified independent (Device/Increase time) Ambulation Distance (Feet): 200 Feet Assistive device: Crutches Gait Pattern/deviations: Step-through pattern     General Gait Details: pt managing crutches with no difficulty.  Crutches readjusted to fit better.  Pt also mobilized 400' with knee scooter with min cues  Stairs Stairs: Yes Stairs assistance: Min guard Stair Management: One rail Right;Step to pattern;Forwards;With crutches Number of Stairs: 4 General stair comments: cues for crutch placement and increased reliance on UEs vs LEs and hopping  Wheelchair Mobility    Modified Rankin  (Stroke Patients Only)       Balance                                             Pertinent Vitals/Pain 5/10 at rest; 8/10 with dependency of R LE during ambulation    Home Living Family/patient expects to be discharged to:: Private residence Living Arrangements: Children Available Help at Discharge: Family Type of Home: House Home Access: Stairs to enter Entrance Stairs-Rails: Right Entrance Stairs-Number of Steps: 2 Home Layout: One level Home Equipment: Crutches      Prior Function Level of Independence: Independent with assistive device(s)         Comments: pt on crutches x 1 week prior to surgery     Hand Dominance        Extremity/Trunk Assessment   Upper Extremity Assessment: Overall WFL for tasks assessed           Lower Extremity Assessment: RLE deficits/detail RLE Deficits / Details: ankle/foot immobilized    Cervical / Trunk Assessment: Normal  Communication   Communication: No difficulties  Cognition Arousal/Alertness: Awake/alert Behavior During Therapy: WFL for tasks assessed/performed Overall Cognitive Status: Within Functional Limits for tasks assessed                      General Comments      Exercises        Assessment/Plan    PT Assessment Patent does not need any further PT services  PT Diagnosis     PT Problem List    PT Treatment Interventions  PT Goals (Current goals can be found in the Care Plan section) Acute Rehab PT Goals Patient Stated Goal: Resume previous lifestyle asap PT Goal Formulation: No goals set, d/c therapy    Frequency     Barriers to discharge        Co-evaluation               End of Session   Activity Tolerance: Patient tolerated treatment well Patient left: in bed;with call bell/phone within reach Nurse Communication: Mobility status    Functional Assessment Tool Used: clinical judgement Functional Limitation: Mobility: Walking and moving  around Mobility: Walking and Moving Around Current Status (Q3335): At least 1 percent but less than 20 percent impaired, limited or restricted Mobility: Walking and Moving Around Goal Status [phone]): At least 1 percent but less than 20 percent impaired, limited or restricted Mobility: Walking and Moving Around Discharge Status [phone]): At least 1 percent but less than 20 percent impaired, limited or restricted    Time: 0806-0845 PT Time Calculation (min): 39 min   Charges:   PT Evaluation $Initial PT Evaluation Tier I: 1 Procedure PT Treatments $Gait Training: 8-22 mins $Therapeutic Activity: 8-22 mins   PT G Codes:   Functional Assessment Tool Used: clinical judgement Functional Limitation: Mobility: Walking and moving around    [employer] 10/31/2013, 8:57 AM

## 2013-10-31 NOTE — Progress Notes (Signed)
Subjective: 1 Day Post-Op Procedure(s) (LRB): RIGHT ACHILLES TENDON REPAIR WITH GRAFT  (Right) Patient reports pain as 2 on 0-10 scale.  Doing well today. Will DC on Lovenox.  Objective: Vital signs in last 24 hours: Temp:  [95.9 F (35.5 C)-98 F (36.7 C)] 98 F (36.7 C) (05/23 0630) Pulse Rate:  [53-73] 58 (05/23 0630) Resp:  [9-16] 16 (05/23 0630) BP: (107-126)/(60-82) 110/72 mmHg (05/23 0630) SpO2:  [94 %-100 %] 97 % (05/23 0630) Weight:  [82.555 kg (182 lb)] 82.555 kg (182 lb) (05/22 1219)  Intake/Output from previous day: 05/22 0701 - 05/23 0700 In: 4180 [P.O.:480; I.V.:3550; IV Piggyback:150] Out: 2625 [Urine:2625] Intake/Output this shift:     Recent Labs  10/30/13 0730  HGB 15.2    Recent Labs  10/30/13 0730  WBC 4.7  RBC 5.05  HCT 43.9  PLT 169    Recent Labs  10/30/13 0730 10/31/13 0534  NA 143 144  K 4.1 4.5  CL 105 106  CO2 28 30  BUN 17 10  CREATININE 0.99 1.01  GLUCOSE 93 95  CALCIUM 9.0 8.5    Recent Labs  10/30/13 0730  INR 1.04    Sensation intact distally  Assessment/Plan: 1 Day Post-Op Procedure(s) (LRB): RIGHT ACHILLES TENDON REPAIR WITH GRAFT  (Right) Up with therapy.Will Dc on Lovenox and see in office next Frisay.  Georges Lynch Yaire Kreher 10/31/2013, 7:46 AM

## 2013-10-31 NOTE — Progress Notes (Signed)
CARE MANAGEMENT NOTE 10/31/2013  Patient:  Jose Rhodes, Jose Rhodes   Account Number:  [id number]  Date Initiated:  10/31/2013  Documentation initiated by:  [facility]  Subjective/Objective Assessment:   RIGHT ACHILLES TENDON REPAIR WITH GRAFT     Action/Plan:   no PT follow up recommended   Anticipated DC Date:  10/31/2013   Anticipated DC Plan:  HOME/SELF CARE      DC Planning Services  CM consult      Choice offered to / List presented to:             Status of service:  Completed, signed off Medicare Important Message given?   (If response is "NO", the following Medicare IM given date fields will be blank) Date Medicare IM given:   Date Additional Medicare IM given:    Discharge Disposition:  HOME/SELF CARE  Per UR Regulation:    If discussed at Long Length of Stay Meetings, dates discussed:    Comments:  10/31/2013 1045 NCM spoke to pt. Contacted [facility] and [facility] for knee walker. [facility] states pt can pick up at home store. Provided pt with Rx for knee walker and directions for retail store. Explained to pt that knee walker may not be covered by his insurance and instructed him to contact plan to follow up on coverage. Pt has crutches at home. States his 57 year old at home to assist him. Contacted [facility] to follow up on Lovenox. They do have in stock. Isidoro Donning RN CCM Case Mgmt phone [phone]

## 2013-10-31 NOTE — Progress Notes (Signed)
D/C instructions reviewed w/ pt. All questions answered, no further questions. Pt d/c in w/c in stable condition by NT to SO's car. Pt in possession of d/c instructions, scripts, and all personal belongings.

## 2013-10-31 NOTE — Evaluation (Signed)
Occupational Therapy Evaluation Patient Details Name: Jose Rhodes MRN: [id number] DOB: [birth year] Today's Date: 10/31/2013    History of Present Illness R achillies repair   Clinical Impression   Pt presents to OT s/p R achilles repair. Pt is mod I with ADL activity     Follow Up Recommendations  No OT follow up          Precautions / Restrictions Precautions Precautions: Fall Restrictions Weight Bearing Restrictions: Yes RLE Weight Bearing: Non weight bearing      Mobility Bed Mobility Overal bed mobility: Modified Independent             General bed mobility comments: no difficulties  Transfers Overall transfer level: Modified independent Equipment used: Crutches Transfers: Sit to/from Stand Sit to Stand: Min guard;Supervision;Modified independent (Device/Increase time)         General transfer comment: cues for management of crutches, saftey in transition position and use of UEs         ADL Overall ADL's : Modified independent                                       General ADL Comments: with crutches           Extremity/Trunk Assessment Upper Extremity Assessment Upper Extremity Assessment: Overall WFL for tasks assessed   Lower Extremity Assessment Lower Extremity Assessment: RLE deficits/detail RLE Deficits / Details: ankle/foot immobilized   Cervical / Trunk Assessment Cervical / Trunk Assessment: Normal   Communication Communication Communication: No difficulties   Cognition Arousal/Alertness: Awake/alert Behavior During Therapy: WFL for tasks assessed/performed Overall Cognitive Status: Within Functional Limits for tasks assessed                                Home Living Family/patient expects to be discharged to:: Private residence Living Arrangements: Children Available Help at Discharge: Family Type of Home: House Home Access: Stairs to enter Secretary/administrator of Steps: 2 Entrance  Stairs-Rails: Right Home Layout: One level     Bathroom Shower/Tub: Runner, broadcasting/film/video: Crutches          Prior Functioning/Environment Level of Independence: Independent with assistive device(s)        Comments: pt on crutches x 1 week prior to surgery             OT Goals(Current goals can be found in the care plan section) Acute Rehab OT Goals Patient Stated Goal: Resume previous lifestyle asap  OT Frequency:      End of Session Nurse Communication: Mobility status  Activity Tolerance: Patient tolerated treatment well Patient left: in bed;with call bell/phone within reach   Time: 1015-1040 OT Time Calculation (min): 25 min Charges:  OT General Charges $OT Visit: 1 Procedure OT Evaluation $Initial OT Evaluation Tier I: 1 Procedure OT Treatments $Self Care/Home Management : 8-22 mins G-Codes: OT G-codes **NOT FOR INPATIENT CLASS** Functional Assessment Tool Used: clinical observation Functional Limitation: Self care Self Care Current Status (B5597): At least 1 percent but less than 20 percent impaired, limited or restricted Self Care Goal Status (C1638): At least 1 percent but less than 20 percent impaired, limited or restricted Self Care Discharge Status [phone]): At least 1 percent but less than 20 percent impaired, limited or restricted  Alba Cory 10/31/2013, 10:43 AM

## 2013-10-31 NOTE — Progress Notes (Signed)
Lovenox self injection and teaching done. Pt self injected lovenox competently under this writer's supervision. Pt verbalizes and demonstrates competence w/ self injection. Bleeding precautions reviewed.

## 2013-11-03 ENCOUNTER — Encounter (HOSPITAL_COMMUNITY): Payer: Self-pay | Admitting: Orthopedic Surgery

## 2013-11-09 NOTE — Discharge Summary (Signed)
Physician Discharge Summary   Patient ID: BASCOM BIEL MRN: [id number] DOB/AGE: [birth year] 57 y.o.  Admit date: 10/30/2013 Discharge date: 10/31/2013  Primary Diagnosis: Right Achilles tendon rupture  Admission Diagnoses:  Past Medical History  Diagnosis Date  . Complication of anesthesia     SHORT TERM MEMORY LOSS AFTER BACK SURGERY  . Seizures     FIRST 4 YRS OF LIFE - SEIZURES ASSOC WITH FEVER  . HNP (herniated nucleus pulposus), cervical     PT STATES HE HAS HAD HERNIATED DISK - NECK - NO SURGERY - CHIROPRACTIC TREATMENTS HELPED.  Marland Kitchen Pain     LOWER BACK - PREVIOUS LUMBAR SURGERY - PT STATES STRETCHING AND EXERCISE HELP THE DISCOMFORT.  Marland Kitchen History of kidney stones     PASSED THE STONES - NO SURGERIES OR LITHOTRIPSY  . Achilles tendon rupture     RIGHT - PT USING CRUTCHES WHEN AMBULATING   Discharge Diagnoses:   Active Problems:   Ruptured Achilles tendon due to trauma  Estimated body mass index is 25.4 kg/(m^2) as calculated from the following:   Height as of this encounter: $RemoveBeforeD'5\' 11"'[id number]$  (1.803 m).   Weight as of this encounter: 82.555 kg (182 lb).  Procedure:  Procedure(s) (LRB): RIGHT ACHILLES TENDON REPAIR WITH GRAFT  (Right)   Consults: None  HPI: The patient is a 58 year old male who presented pain which began 3 days ago following a specific injury. The injury occurred while the patient was playing sports. He was playing in a baseball game and felt a pop in his Achilles when he stepped on third base. Symptoms are reported to be located in the right ankle and include swelling, decreased range of motion, difficulty bearing weight and difficulty ambulating. The patient does describe their pain as sharp and aching. The patient describes their symptoms as moderate in severity. Symptoms are exacerbated by weight bearing and walking. Symptoms are relieved by immobilization and non-opioid analgesics.   Laboratory Data: Admission on 10/30/2013, Discharged on 10/31/2013  Component  Date Value Ref Range Status  . WBC 10/30/2013 4.7  4.0 - 10.5 K/uL Final  . RBC 10/30/2013 5.05  4.22 - 5.81 MIL/uL Final  . Hemoglobin 10/30/2013 15.2  13.0 - 17.0 g/dL Final  . HCT 10/30/2013 43.9  39.0 - 52.0 % Final  . MCV 10/30/2013 86.9  78.0 - 100.0 fL Final  . MCH 10/30/2013 30.1  26.0 - 34.0 pg Final  . MCHC 10/30/2013 34.6  30.0 - 36.0 g/dL Final  . RDW 10/30/2013 12.7  11.5 - 15.5 % Final  . Platelets 10/30/2013 169  150 - 400 K/uL Final  . aPTT 10/30/2013 26  24 - 37 seconds Final  . Sodium 10/30/2013 143  137 - 147 mEq/L Final  . Potassium 10/30/2013 4.1  3.7 - 5.3 mEq/L Final  . Chloride 10/30/2013 105  96 - 112 mEq/L Final  . CO2 10/30/2013 28  19 - 32 mEq/L Final  . Glucose, Bld 10/30/2013 93  70 - 99 mg/dL Final  . BUN 10/30/2013 17  6 - 23 mg/dL Final  . Creatinine, Ser 10/30/2013 0.99  0.50 - 1.35 mg/dL Final  . Calcium 10/30/2013 9.0  8.4 - 10.5 mg/dL Final  . Total Protein 10/30/2013 6.6  6.0 - 8.3 g/dL Final  . Albumin 10/30/2013 3.7  3.5 - 5.2 g/dL Final  . AST 10/30/2013 19  0 - 37 U/L Final  . ALT 10/30/2013 18  0 - 53 U/L Final  . Alkaline Phosphatase  10/30/2013 73  39 - 117 U/L Final  . Total Bilirubin 10/30/2013 0.8  0.3 - 1.2 mg/dL Final  . GFR calc non Af Amer 10/30/2013 90* >90 mL/min Final  . GFR calc Af Amer 10/30/2013 >90  >90 mL/min Final   Comment: (NOTE)                          The eGFR has been calculated using the CKD EPI equation.                          This calculation has not been validated in all clinical situations.                          eGFR's persistently <90 mL/min signify possible Chronic Kidney                          Disease.  Marland Kitchen Prothrombin Time 10/30/2013 13.4  11.6 - 15.2 seconds Final  . INR 10/30/2013 1.04  0.00 - 1.49 Final  . Color, Urine 10/30/2013 YELLOW  YELLOW Final  . APPearance 10/30/2013 CLOUDY* CLEAR Final  . Specific Gravity, Urine 10/30/2013 1.023  1.005 - 1.030 Final  . pH 10/30/2013 5.5  5.0 - 8.0 Final    . Glucose, UA 10/30/2013 NEGATIVE  NEGATIVE mg/dL Final  . Hgb urine dipstick 10/30/2013 NEGATIVE  NEGATIVE Final  . Bilirubin Urine 10/30/2013 NEGATIVE  NEGATIVE Final  . Ketones, ur 10/30/2013 NEGATIVE  NEGATIVE mg/dL Final  . Protein, ur 10/30/2013 NEGATIVE  NEGATIVE mg/dL Final  . Urobilinogen, UA 10/30/2013 0.2  0.0 - 1.0 mg/dL Final  . Nitrite 10/30/2013 NEGATIVE  NEGATIVE Final  . Leukocytes, UA 10/30/2013 NEGATIVE  NEGATIVE Final   MICROSCOPIC NOT DONE ON URINES WITH NEGATIVE PROTEIN, BLOOD, LEUKOCYTES, NITRITE, OR GLUCOSE <1000 mg/dL.  Marland Kitchen Sodium 10/31/2013 144  137 - 147 mEq/L Final  . Potassium 10/31/2013 4.5  3.7 - 5.3 mEq/L Final  . Chloride 10/31/2013 106  96 - 112 mEq/L Final  . CO2 10/31/2013 30  19 - 32 mEq/L Final  . Glucose, Bld 10/31/2013 95  70 - 99 mg/dL Final  . BUN 10/31/2013 10  6 - 23 mg/dL Final  . Creatinine, Ser 10/31/2013 1.01  0.50 - 1.35 mg/dL Final  . Calcium 10/31/2013 8.5  8.4 - 10.5 mg/dL Final  . GFR calc non Af Amer 10/31/2013 81* >90 mL/min Final  . GFR calc Af Amer 10/31/2013 >90  >90 mL/min Final   Comment: (NOTE)                          The eGFR has been calculated using the CKD EPI equation.                          This calculation has not been validated in all clinical situations.                          eGFR's persistently <90 mL/min signify possible Chronic Kidney                          Disease.     X-Rays:Dg Chest 2 View  10/30/2013   CLINICAL DATA:  Preoperative for ruptured Achilles  tendon.  EXAM: CHEST  2 VIEW  COMPARISON:  None.  FINDINGS: The heart size and mediastinal contours are within normal limits. Both lungs are clear. The visualized skeletal structures are unremarkable.  IMPRESSION: No active cardiopulmonary disease.   Electronically Signed   By: Sherryl Barters M.D.   On: 10/30/2013 08:26   Dg Ankle Complete Right  10/25/2013   CLINICAL DATA:  Excruciating right ankle pain  EXAM: RIGHT ANKLE - COMPLETE 3+ VIEW   COMPARISON:  None.  FINDINGS: There is no evidence of fracture, dislocation. There is no evidence of arthropathy or other focal bone abnormality. There is swelling posterior soft tissues of the ankle.  IMPRESSION: No acute bony injury. If Achilles tendon tear is of clinical concern, MRI would be helpful.   Electronically Signed   By: Abelardo Diesel M.D.   On: 10/25/2013 18:17    EKG: Orders placed during the hospital encounter of 10/30/13  . EKG 12-LEAD  . EKG 12-LEAD  . EKG 12-LEAD  . EKG 12-LEAD  . EKG 12-LEAD     Hospital Course: Jose Rhodes is a 58 y.o. who was admitted to [facility]. They were brought to the operating room on 10/30/2013 and underwent Procedure(s): RIGHT ACHILLES TENDON REPAIR WITH GRAFT .  Patient tolerated the procedure well and was later transferred to the recovery room and then to the orthopaedic floor for postoperative care.  They were given PO and IV analgesics for pain control following their surgery.  They were given 24 hours of postoperative antibiotics of  Anti-infectives   Start     Dose/Rate Route Frequency Ordered Stop   10/30/13 1500  ceFAZolin (ANCEF) IVPB 1 g/50 mL premix     1 g 100 mL/hr over 30 Minutes Intravenous Every 6 hours 10/30/13 1223 10/31/13 0252   10/30/13 1017  polymyxin B 500,000 Units, bacitracin 50,000 Units in sodium chloride irrigation 0.9 % 500 mL irrigation  Status:  Discontinued       As needed 10/30/13 1017 10/30/13 1116   10/30/13 0651  ceFAZolin (ANCEF) IVPB 2 g/50 mL premix     2 g 100 mL/hr over 30 Minutes Intravenous On call to O.R. 10/30/13 5638 10/30/13 0915     and started on DVT prophylaxis in the form of Lovenox.   PT was ordered. Discharge planning consulted to help with postop disposition and equipment needs.  Patient had a good night on the evening of surgery.  They started to get up OOB with therapy on day one.  Patient was seen in rounds and was ready to go home.   Diet: Regular diet Activity:NWB  RLE Follow-up:in 1 week Disposition - Home Discharged Condition: good   Discharge Instructions   Call MD / Call 911    Complete by:  As directed   If you experience chest pain or shortness of breath, CALL 911 and be transported to the hospital emergency room.  If you develope a fever above 101 F, pus (white drainage) or increased drainage or redness at the wound, or calf pain, call your surgeon's office.     Discharge instructions    Complete by:  As directed   Jose Rhodes  MRN: [id number] DOB/Age: [birth year] 58 y.o. Physician: Kipp Brood. Gioffre Procedure: @RRSURGERY @ @RRDAYPOSTSURGERY @  Cast in good condition. Ambulate Non-Weight bearing. See me in office on Friday.Inject yourself as instructed daily with Lovenox.Discharged today with crutches.   Vital Signs @VSRANGES @     Status of Cast: good  Plan  Return to clinic for follow-up: Friday, call for appointment Pt to be discharged on [place] 10/31/2013, 7:53 AM  No weight on Right foot. See Dr.Gioffre in office next Friday. Use Lovenox Injections daily.     Driving restrictions    Complete by:  As directed   No driving for 2 weeks     Increase activity slowly as tolerated    Complete by:  As directed             Medication List    STOP taking these medications       HYDROcodone-acetaminophen 5-325 MG per tablet  Commonly known as:  NORCO/VICODIN     naproxen 500 MG tablet  Commonly known as:  NAPROSYN      TAKE these medications       ALPRAZolam 1 MG tablet  Commonly known as:  XANAX  Take 1 mg by mouth at bedtime as needed for sleep.     aspirin 325 MG tablet  Take 325 mg by mouth daily.     enoxaparin 40 MG/0.4ML injection  Commonly known as:  LOVENOX  Inject 0.4 mLs (40 mg total) into the skin daily.     oxyCODONE-acetaminophen 5-325 MG per tablet  Commonly known as:  PERCOCET/ROXICET  Take 1-2 tablets by mouth every 4 (four) hours as needed for moderate pain.            Follow-up Information   Follow up with [facility]. (to pick up knee walker)    Contact information:   [address] [place] [address] [phone]       Signed: Arlee Muslim, PA-C Orthopaedic Surgery 11/09/2013, 8:52 AM

## 2015-08-13 IMAGING — CR DG ANKLE COMPLETE 3+V*R*
3 series · 3 of 3 positions shown · non-contrast
Comparison: None.

CLINICAL DATA: Excruciating right ankle pain

EXAM:
RIGHT ANKLE - COMPLETE 3+ VIEW

[t ankle joint ap right]
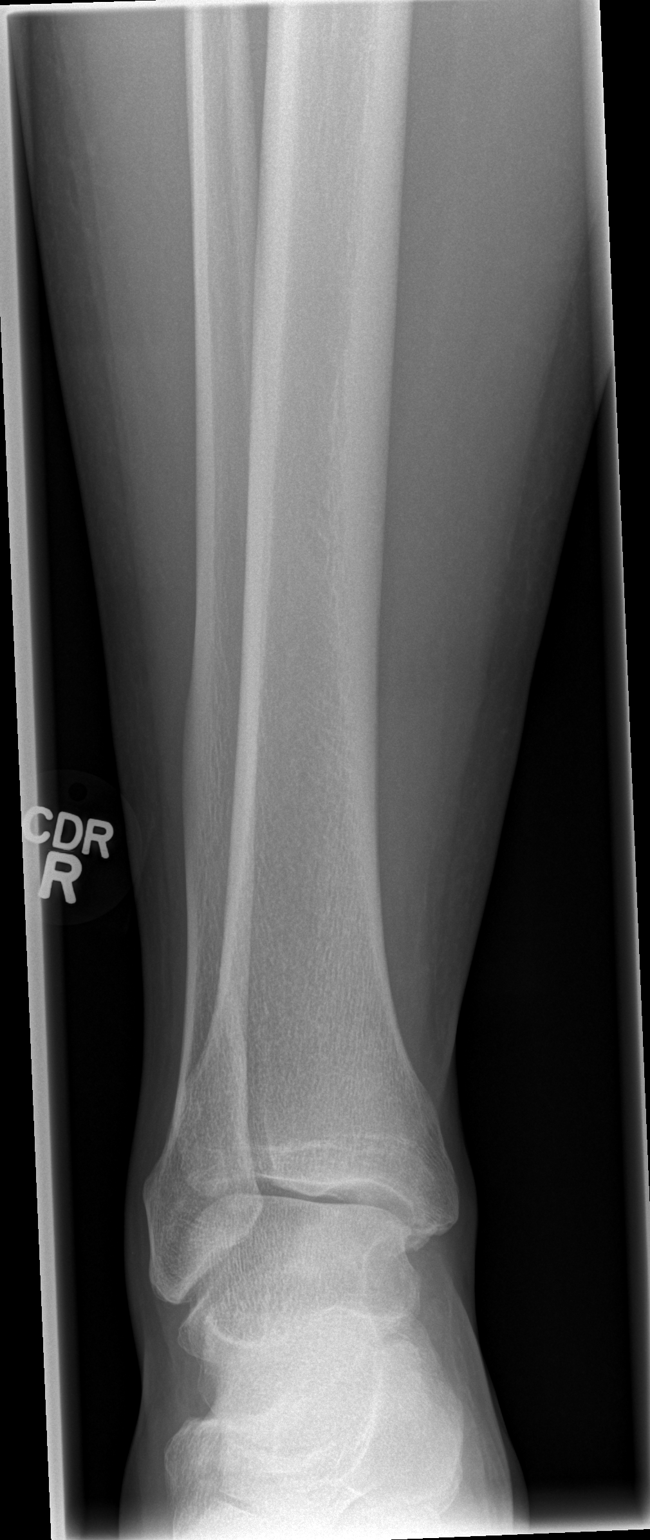

[t ankle joint oblique right]
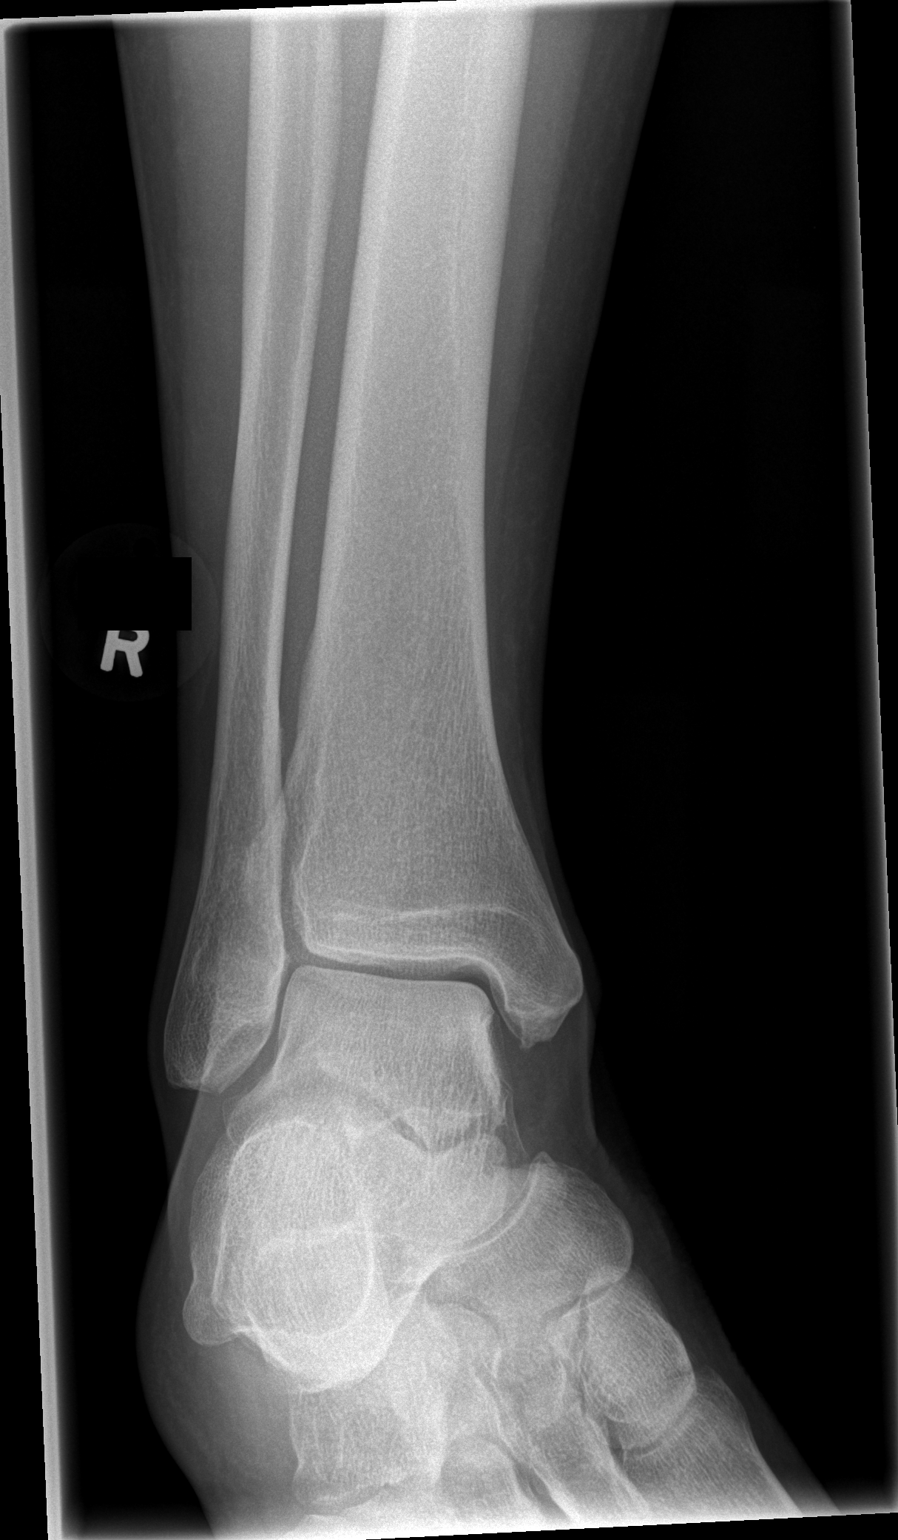

[t ankle joint lat right]
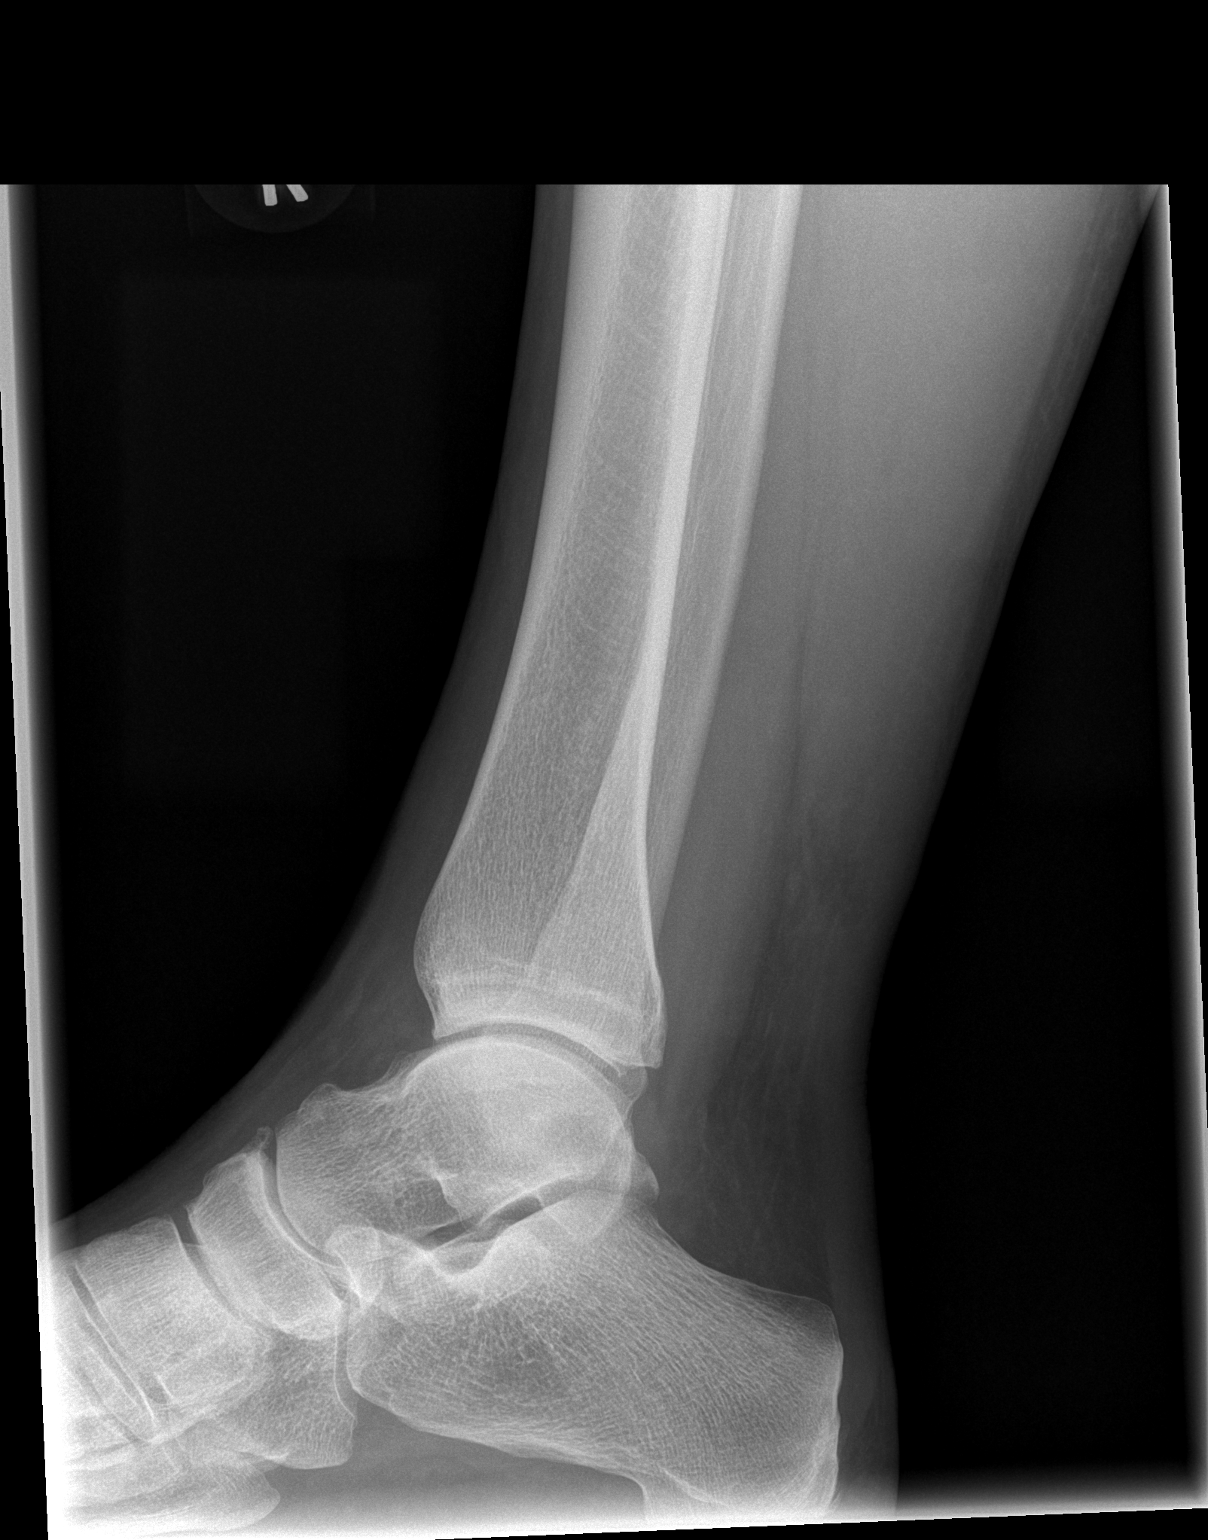

[3 of 3 positions shown; findings below may reference images not displayed]

FINDINGS: There is no evidence of fracture, dislocation. There is no evidence
of arthropathy or other focal bone abnormality. There is swelling
posterior soft tissues of the ankle.
IMPRESSION: No acute bony injury. If Achilles tendon tear is of clinical
concern, MRI would be helpful.

## 2015-08-17 ENCOUNTER — Encounter: Payer: Self-pay | Admitting: Gastroenterology

## 2016-04-17 ENCOUNTER — Encounter: Payer: Self-pay | Admitting: Gastroenterology

## 2016-06-05 ENCOUNTER — Ambulatory Visit (INDEPENDENT_AMBULATORY_CARE_PROVIDER_SITE_OTHER): Payer: 59 | Admitting: Gastroenterology

## 2016-06-05 ENCOUNTER — Encounter: Payer: Self-pay | Admitting: Gastroenterology

## 2016-06-05 ENCOUNTER — Encounter (INDEPENDENT_AMBULATORY_CARE_PROVIDER_SITE_OTHER): Payer: Self-pay

## 2016-06-05 VITALS — BP 110/72 | HR 53 | Ht 71.0 in | Wt 186.0 lb

## 2016-06-05 DIAGNOSIS — K625 Hemorrhage of anus and rectum: Secondary | ICD-10-CM

## 2016-06-05 DIAGNOSIS — Z800 Family history of malignant neoplasm of digestive organs: Secondary | ICD-10-CM

## 2016-06-05 MED ORDER — NA SULFATE-K SULFATE-MG SULF 17.5-3.13-1.6 GM/177ML PO SOLN: 1.0000 | Freq: Once | ORAL | 0 refills | Status: AC

## 2016-06-05 NOTE — Patient Instructions (Signed)
If you are age 59 or older, your body mass index should be between 23-30. Your Body mass index is 25.94 kg/m. If this is out of the aforementioned range listed, please consider follow up with your Primary Care Provider.  If you are age 59 or younger, your body mass index should be between 19-25. Your Body mass index is 25.94 kg/m. If this is out of the aformentioned range listed, please consider follow up with your Primary Care Provider.   We have sent the following medications to your pharmacy for you to pick up at your convenience:  Suprep  You have been scheduled for a colonoscopy. Please follow written instructions given to you at your visit today.  Please pick up your prep supplies at the pharmacy within the next 1-3 days. If you use inhalers (even only as needed), please bring them with you on the day of your procedure. Your physician has requested that you go to www.startemmi.com and enter the access code given to you at your visit today. This web site gives a general overview about your procedure. However, you should still follow specific instructions given to you by our office regarding your preparation for the procedure.  Thank you.

## 2016-06-05 NOTE — Progress Notes (Signed)
HPI :  60 y/o male here to discuss screening colonoscopy. He has not been seen in our clinic for over 5 years. FH of colon cancer, mother had it at age 59. Brother has had colon polyps, around late 440s, early 3850s.  He has occasional bright red blood in his stools, he thinks ongoing periodically and longstanding. Usually a trace amount. No baseline constipation or diarrhea, diet influences his regularity. No abdominal pains. No perianal pains. No weight loss.  Mother also had breast cancer at age 59.   Colonoscopy 08/03/2010 - normal  Past Medical History:  Diagnosis Date  . Achilles tendon rupture    RIGHT - PT USING CRUTCHES WHEN AMBULATING  . Complication of anesthesia    SHORT TERM MEMORY LOSS AFTER BACK SURGERY  . History of kidney stones    PASSED THE STONES - NO SURGERIES OR LITHOTRIPSY  . HNP (herniated nucleus pulposus), cervical    PT STATES HE HAS HAD HERNIATED DISK - NECK - NO SURGERY - CHIROPRACTIC TREATMENTS HELPED.  Marland Kitchen. Pain    LOWER BACK - PREVIOUS LUMBAR SURGERY - PT STATES STRETCHING AND EXERCISE HELP THE DISCOMFORT.  Marland Kitchen. Seizures (HCC)    FIRST 4 YRS OF LIFE - SEIZURES ASSOC WITH FEVER     Past Surgical History:  Procedure Laterality Date  . ACHILLES TENDON SURGERY Right 10/30/2013   Procedure: RIGHT ACHILLES TENDON REPAIR WITH GRAFT ;  Surgeon: Jacki Conesonald A Gioffre, MD;  Location: [facility];  Service: Orthopedics;  Laterality: Right;  . BACK SURGERY  july 1990   l5-3-1   Family History  Problem Relation Age of Onset  . Coronary artery disease Father   . Colon cancer Mother   . Breast cancer Mother   . Hypertension Brother   . Colon polyps Brother    Social History  Substance Use Topics  . Smoking status: Never Smoker  . Smokeless tobacco: Never Used  . Alcohol use No   Current Outpatient Prescriptions  Medication Sig Dispense Refill  . ALPRAZolam (XANAX) 1 MG tablet Take 1 mg by mouth at bedtime as needed for sleep.     No current facility-administered  medications for this visit.    No Known Allergies   Review of Systems: All systems reviewed and negative except where noted in HPI.   Lab Results  Component Value Date   WBC 4.7 10/30/2013   HGB 15.2 10/30/2013   HCT 43.9 10/30/2013   MCV 86.9 10/30/2013   PLT 169 10/30/2013    Lab Results  Component Value Date   CREATININE 1.01 10/31/2013   BUN 10 10/31/2013   NA 144 10/31/2013   K 4.5 10/31/2013   CL 106 10/31/2013   CO2 30 10/31/2013    Lab Results  Component Value Date   ALT 18 10/30/2013   AST 19 10/30/2013   ALKPHOS 73 10/30/2013   BILITOT 0.8 10/30/2013      Physical Exam: BP 110/72   Pulse (!) 53   Ht 5\' 11"  (1.803 m)   Wt 186 lb (84.4 kg)   BMI 25.94 kg/m  Constitutional: Pleasant,well-developed, male in no acute distress. HEENT: Normocephalic and atraumatic. Conjunctivae are normal. No scleral icterus. Neck supple.  Cardiovascular: Normal rate, regular rhythm.  Pulmonary/chest: Effort normal and breath sounds normal. No wheezing, rales or rhonchi. Abdominal: Soft, nondistended, nontender.  There are no masses palpable. No hepatomegaly. Extremities: no edema Lymphadenopathy: No cervical adenopathy noted. Neurological: Alert and oriented to person place and time. Skin: Skin is  warm and dry. No rashes noted. Psychiatric: Normal mood and affect. Behavior is normal.   ASSESSMENT AND PLAN: 60 year old male with family history of colon cancer as outlined above, here to discuss colon cancer screening. His last colonoscopy was in 2012 and negative. He does endorse occasional rectal bleeding.  I discussed guidelines for screening for colon cancer with him. While he does have a family member with colon cancer, guidelines recommend colonoscopy intervals as same for average risk patients if first-degree family member with the diagnosis was greater than age 59. However given his occasional rectal bleeding I offered him colonoscopy now to clarify source of these  symptoms, and discussed risks and benefits of colonoscopy with him. Following this discussion he wished to proceed. If he has no adenomas or high-risk lesions on this exam that he will extend the screening intervals to every 10 years.  Ileene PatrickSteven Armbruster, MD [facility] Pager [phone]  CC: Nila NephewGreen, Edwin, MD

## 2016-07-19 ENCOUNTER — Encounter: Payer: Self-pay | Admitting: Gastroenterology

## 2016-08-02 ENCOUNTER — Encounter: Payer: Self-pay | Admitting: Gastroenterology

## 2016-08-02 ENCOUNTER — Ambulatory Visit (AMBULATORY_SURGERY_CENTER): Payer: 59 | Admitting: Gastroenterology

## 2016-08-02 VITALS — BP 112/73 | HR 49 | Temp 98.0°F | Resp 18 | Ht 71.0 in | Wt 186.0 lb

## 2016-08-02 DIAGNOSIS — K625 Hemorrhage of anus and rectum: Secondary | ICD-10-CM

## 2016-08-02 DIAGNOSIS — K649 Unspecified hemorrhoids: Secondary | ICD-10-CM | POA: Diagnosis not present

## 2016-08-02 DIAGNOSIS — Z800 Family history of malignant neoplasm of digestive organs: Secondary | ICD-10-CM | POA: Diagnosis not present

## 2016-08-02 MED ORDER — SODIUM CHLORIDE 0.9 % IV SOLN: 500.0000 mL | INTRAVENOUS | Status: AC

## 2016-08-02 NOTE — Progress Notes (Signed)
To recovery vss report to  Cecilia RN 

## 2016-08-02 NOTE — Op Note (Signed)
[facility] Patient Name: Jose Rhodes Due Procedure Date: 08/02/2016 11:12 AM MRN: [id number] Endoscopist: Viviann Spare P. Armbruster MD, MD Age: 61 Referring MD:  Date of Birth: [birth year] Gender: Male Account #: [id number] Procedure:                Colonoscopy Indications:              Hematochezia, Family history of colon cancer in a                            first-degree relative (mother age 1) Medicines:                Monitored Anesthesia Care Procedure:                Pre-Anesthesia Assessment:                           - Prior to the procedure, a History and Physical                            was performed, and patient medications and                            allergies were reviewed. The patient's tolerance of                            previous anesthesia was also reviewed. The risks                            and benefits of the procedure and the sedation                            options and risks were discussed with the patient.                            All questions were answered, and informed consent                            was obtained. Prior Anticoagulants: The patient has                            taken no previous anticoagulant or antiplatelet                            agents. ASA Grade Assessment: I - A normal, healthy                            patient. After reviewing the risks and benefits,                            the patient was deemed in satisfactory condition to                            undergo the procedure.  After obtaining informed consent, the colonoscope                            was passed under direct vision. Throughout the                            procedure, the patient's blood pressure, pulse, and                            oxygen saturations were monitored continuously. The                            Model CF-HQ190L [id number]) scope was introduced                            through the anus and  advanced to the the terminal                            ileum, with identification of the appendiceal                            orifice and IC valve. The colonoscopy was performed                            without difficulty. The patient tolerated the                            procedure well. The quality of the bowel                            preparation was good. The terminal ileum, ileocecal                            valve, appendiceal orifice, and rectum were                            photographed. Scope In: 11:16:39 AM Scope Out: 11:34:44 AM Scope Withdrawal Time: 0 hours 15 minutes 19 seconds  Total Procedure Duration: 0 hours 18 minutes 5 seconds  Findings:                 The perianal and digital rectal examinations were                            normal.                           Internal hemorrhoids were found during                            retroflexion. The hemorrhoids were moderate.                           The terminal ileum appeared normal.  The exam was otherwise without abnormality. No                            polyps. Complications:            No immediate complications. Estimated blood loss:                            None. Estimated Blood Loss:     Estimated blood loss: none. Impression:               - Internal hemorrhoids.                           - The examined portion of the ileum was normal.                           - The examination was otherwise normal.                           - No polyps                           Overall, hemorrhoids are the likely cause for the                            patient's symptoms. Recommendation:           - Patient has a contact number available for                            emergencies. The signs and symptoms of potential                            delayed complications were discussed with the                            patient. Return to normal activities tomorrow.                             Written discharge instructions were provided to the                            patient.                           - Resume previous diet.                           - Continue present medications.                           - Daily fiber supplement for treatment of                            hemorrhoids if bleeding recurs. If symptoms persist  consider hemorrhoid banding.                           - Guidelines recommend repeat colonoscopy in 10                            years for screening purposes (no history of polyps,                            first degree relative with colon cancer diagnosed                            after the age of 67 years) Willaim Rayas. Armbruster MD, MD 08/02/2016 11:39:31 AM This report has been signed electronically.

## 2016-08-02 NOTE — Patient Instructions (Signed)
Discharge instructions given. Handouts on hemorrhoids and a high fiber diet. Resume previous medications. YOU HAD AN ENDOSCOPIC PROCEDURE TODAY AT THE [facility]:   Refer to the procedure report that was given to you for any specific questions about what was found during the examination.  If the procedure report does not answer your questions, please call your gastroenterologist to clarify.  If you requested that your care partner not be given the details of your procedure findings, then the procedure report has been included in a sealed envelope for you to review at your convenience later.  YOU SHOULD EXPECT: Some feelings of bloating in the abdomen. Passage of more gas than usual.  Walking can help get rid of the air that was put into your GI tract during the procedure and reduce the bloating. If you had a lower endoscopy (such as a colonoscopy or flexible sigmoidoscopy) you may notice spotting of blood in your stool or on the toilet paper. If you underwent a bowel prep for your procedure, you may not have a normal bowel movement for a few days.  Please Note:  You might notice some irritation and congestion in your nose or some drainage.  This is from the oxygen used during your procedure.  There is no need for concern and it should clear up in a day or so.  SYMPTOMS TO REPORT IMMEDIATELY:   Following lower endoscopy (colonoscopy or flexible sigmoidoscopy):  Excessive amounts of blood in the stool  Significant tenderness or worsening of abdominal pains  Swelling of the abdomen that is new, acute  Fever of 100F or higher   For urgent or emergent issues, a gastroenterologist can be reached at any hour by calling [phone].   DIET:  We do recommend a small meal at first, but then you may proceed to your regular diet.  Drink plenty of fluids but you should avoid alcoholic beverages for 24 hours.  ACTIVITY:  You should plan to take it easy for the rest of today and you should  NOT DRIVE or use heavy machinery until tomorrow (because of the sedation medicines used during the test).    FOLLOW UP: Our staff will call the number listed on your records the next business day following your procedure to check on you and address any questions or concerns that you may have regarding the information given to you following your procedure. If we do not reach you, we will leave a message.  However, if you are feeling well and you are not experiencing any problems, there is no need to return our call.  We will assume that you have returned to your regular daily activities without incident.  If any biopsies were taken you will be contacted by phone or by letter within the next 1-3 weeks.  Please call us at [phone] if you have not heard about the biopsies in 3 weeks.    SIGNATURES/CONFIDENTIALITY: You and/or your care partner have signed paperwork which will be entered into your electronic medical record.  These signatures attest to the fact that that the information above on your After Visit Summary has been reviewed and is understood.  Full responsibility of the confidentiality of this discharge information lies with you and/or your care-partner. 

## 2016-08-03 ENCOUNTER — Telehealth: Payer: Self-pay | Admitting: *Deleted

## 2016-08-03 NOTE — Telephone Encounter (Signed)
  Follow up Call-  Call back number 08/02/2016  Post procedure Call Back phone  # [phone]  Permission to leave phone message Yes  Some recent data might be hidden     Patient questions:  Do you have a fever, pain , or abdominal swelling? No. Pain Score  0 *  Have you tolerated food without any problems? Yes.    Have you been able to return to your normal activities? Yes.    Do you have any questions about your discharge instructions: Diet   No. Medications  No. Follow up visit  No.  Do you have questions or concerns about your Care? No.  Actions: * If pain score is 4 or above: No action needed, pain <4.

## 2021-08-10 ENCOUNTER — Ambulatory Visit: Payer: Self-pay

## 2024-04-09 ENCOUNTER — Other Ambulatory Visit: Payer: Self-pay | Admitting: Internal Medicine

## 2024-04-09 DIAGNOSIS — Z8249 Family history of ischemic heart disease and other diseases of the circulatory system: Secondary | ICD-10-CM

## 2024-04-16 ENCOUNTER — Ambulatory Visit
Admission: RE | Admit: 2024-04-16 | Discharge: 2024-04-16 | Disposition: A | Discharge status: Home / Self Care | Source: Ambulatory Visit | Attending: Internal Medicine | Admitting: Internal Medicine

## 2024-04-16 DIAGNOSIS — Z8249 Family history of ischemic heart disease and other diseases of the circulatory system: Secondary | ICD-10-CM
